# Patient Record
Sex: Female | Born: 1945 | Race: White | Hispanic: No | Marital: Married | Smoking: Former smoker
Health system: Southern US, Community
[De-identification: ages and names within clinical notes are randomized; demographics above are authoritative.]

## PROBLEM LIST (undated history)

## (undated) DIAGNOSIS — K219 Gastro-esophageal reflux disease without esophagitis: Secondary | ICD-10-CM

## (undated) DIAGNOSIS — T7840XA Allergy, unspecified, initial encounter: Secondary | ICD-10-CM

## (undated) DIAGNOSIS — D649 Anemia, unspecified: Secondary | ICD-10-CM

## (undated) DIAGNOSIS — IMO0001 Reserved for inherently not codable concepts without codable children: Secondary | ICD-10-CM

## (undated) DIAGNOSIS — K5732 Diverticulitis of large intestine without perforation or abscess without bleeding: Secondary | ICD-10-CM

## (undated) DIAGNOSIS — I10 Essential (primary) hypertension: Secondary | ICD-10-CM

## (undated) DIAGNOSIS — M1990 Unspecified osteoarthritis, unspecified site: Secondary | ICD-10-CM

## (undated) DIAGNOSIS — E785 Hyperlipidemia, unspecified: Secondary | ICD-10-CM

## (undated) DIAGNOSIS — F419 Anxiety disorder, unspecified: Secondary | ICD-10-CM

## (undated) HISTORY — DX: Diverticulitis of large intestine without perforation or abscess without bleeding: K57.32

## (undated) HISTORY — PX: FINGER SURGERY: SHX640

## (undated) HISTORY — DX: Essential (primary) hypertension: I10

## (undated) HISTORY — PX: BREAST BIOPSY: SHX20

## (undated) HISTORY — DX: Hyperlipidemia, unspecified: E78.5

## (undated) HISTORY — DX: Gastro-esophageal reflux disease without esophagitis: K21.9

## (undated) HISTORY — DX: Unspecified osteoarthritis, unspecified site: M19.90

## (undated) HISTORY — DX: Anxiety disorder, unspecified: F41.9

## (undated) HISTORY — DX: Allergy, unspecified, initial encounter: T78.40XA

---

## 1982-09-27 HISTORY — PX: FINGER FRACTURE SURGERY: SHX638

## 2002-07-11 DIAGNOSIS — I10 Essential (primary) hypertension: Secondary | ICD-10-CM

## 2002-07-11 HISTORY — DX: Essential (primary) hypertension: I10

## 2004-06-30 ENCOUNTER — Ambulatory Visit: Payer: Self-pay | Admitting: Family Medicine

## 2006-03-22 ENCOUNTER — Emergency Department: Payer: Self-pay | Admitting: Unknown Physician Specialty

## 2006-03-23 ENCOUNTER — Ambulatory Visit: Payer: Self-pay | Admitting: Unknown Physician Specialty

## 2006-04-25 ENCOUNTER — Ambulatory Visit: Payer: Self-pay | Admitting: Family Medicine

## 2006-11-09 DIAGNOSIS — K5732 Diverticulitis of large intestine without perforation or abscess without bleeding: Secondary | ICD-10-CM

## 2006-11-09 HISTORY — DX: Diverticulitis of large intestine without perforation or abscess without bleeding: K57.32

## 2008-11-18 DIAGNOSIS — F4320 Adjustment disorder, unspecified: Secondary | ICD-10-CM | POA: Insufficient documentation

## 2012-09-13 ENCOUNTER — Ambulatory Visit: Payer: Self-pay | Admitting: Internal Medicine

## 2013-07-28 DIAGNOSIS — R2230 Localized swelling, mass and lump, unspecified upper limb: Secondary | ICD-10-CM | POA: Insufficient documentation

## 2014-03-25 ENCOUNTER — Encounter: Payer: Self-pay | Admitting: Family Medicine

## 2014-03-27 ENCOUNTER — Encounter: Payer: Self-pay | Admitting: Family Medicine

## 2014-04-27 ENCOUNTER — Encounter: Payer: Self-pay | Admitting: Family Medicine

## 2014-08-26 ENCOUNTER — Ambulatory Visit: Payer: Self-pay | Admitting: Family Medicine

## 2014-11-12 ENCOUNTER — Ambulatory Visit: Payer: Self-pay | Admitting: Family Medicine

## 2014-11-26 HISTORY — PX: COLONOSCOPY: SHX174

## 2014-12-19 ENCOUNTER — Ambulatory Visit: Payer: Self-pay | Admitting: Gastroenterology

## 2014-12-19 LAB — HM COLONOSCOPY

## 2015-01-07 ENCOUNTER — Ambulatory Visit
Admit: 2015-01-07 | Disposition: A | Discharge status: Home / Self Care | Payer: Self-pay | Attending: Gastroenterology | Admitting: Gastroenterology

## 2015-01-09 DIAGNOSIS — N63 Unspecified lump in unspecified breast: Secondary | ICD-10-CM | POA: Insufficient documentation

## 2015-03-18 DIAGNOSIS — F419 Anxiety disorder, unspecified: Secondary | ICD-10-CM | POA: Insufficient documentation

## 2015-03-18 DIAGNOSIS — E663 Overweight: Secondary | ICD-10-CM | POA: Insufficient documentation

## 2015-03-18 DIAGNOSIS — J45909 Unspecified asthma, uncomplicated: Secondary | ICD-10-CM | POA: Insufficient documentation

## 2015-03-18 DIAGNOSIS — E039 Hypothyroidism, unspecified: Secondary | ICD-10-CM | POA: Insufficient documentation

## 2015-03-18 DIAGNOSIS — K635 Polyp of colon: Secondary | ICD-10-CM | POA: Insufficient documentation

## 2015-03-18 DIAGNOSIS — E038 Other specified hypothyroidism: Secondary | ICD-10-CM | POA: Insufficient documentation

## 2015-03-18 DIAGNOSIS — M1990 Unspecified osteoarthritis, unspecified site: Secondary | ICD-10-CM | POA: Insufficient documentation

## 2015-04-01 ENCOUNTER — Other Ambulatory Visit: Payer: Self-pay | Admitting: Family Medicine

## 2015-04-01 DIAGNOSIS — K219 Gastro-esophageal reflux disease without esophagitis: Secondary | ICD-10-CM

## 2015-04-02 DIAGNOSIS — K219 Gastro-esophageal reflux disease without esophagitis: Secondary | ICD-10-CM | POA: Insufficient documentation

## 2015-04-14 ENCOUNTER — Other Ambulatory Visit: Payer: Self-pay | Admitting: Family Medicine

## 2015-04-14 DIAGNOSIS — E7800 Pure hypercholesterolemia, unspecified: Secondary | ICD-10-CM

## 2015-05-05 ENCOUNTER — Encounter: Payer: Self-pay | Admitting: Family Medicine

## 2015-05-05 ENCOUNTER — Ambulatory Visit (INDEPENDENT_AMBULATORY_CARE_PROVIDER_SITE_OTHER): Payer: Medicare Other | Admitting: Family Medicine

## 2015-05-05 VITALS — BP 126/68 | HR 84 | Temp 97.7°F | Resp 16 | Ht 63.0 in | Wt 169.0 lb

## 2015-05-05 DIAGNOSIS — R0602 Shortness of breath: Secondary | ICD-10-CM

## 2015-05-05 DIAGNOSIS — E7800 Pure hypercholesterolemia, unspecified: Secondary | ICD-10-CM

## 2015-05-05 DIAGNOSIS — I10 Essential (primary) hypertension: Secondary | ICD-10-CM | POA: Diagnosis not present

## 2015-05-05 DIAGNOSIS — F419 Anxiety disorder, unspecified: Secondary | ICD-10-CM

## 2015-05-05 DIAGNOSIS — Z0000 Encounter for general adult medical examination without abnormal findings: Secondary | ICD-10-CM

## 2015-05-05 DIAGNOSIS — E038 Other specified hypothyroidism: Secondary | ICD-10-CM

## 2015-05-05 DIAGNOSIS — E039 Hypothyroidism, unspecified: Secondary | ICD-10-CM

## 2015-05-05 LAB — POCT URINALYSIS DIPSTICK
BILIRUBIN UA: NEGATIVE
Blood, UA: NEGATIVE
Glucose, UA: NEGATIVE
KETONES UA: NEGATIVE
Leukocytes, UA: NEGATIVE
Nitrite, UA: NEGATIVE
Protein, UA: NEGATIVE
SPEC GRAV UA: 1.015
Urobilinogen, UA: 0.2
pH, UA: 6

## 2015-05-05 MED ORDER — ALPRAZOLAM 0.5 MG PO TABS: 0.5000 mg | ORAL_TABLET | Freq: Every day | ORAL | Status: DC

## 2015-05-05 NOTE — Patient Instructions (Signed)
Gastroesophageal Reflux Disease, Adult Gastroesophageal reflux disease (GERD) happens when acid from your stomach flows up into the esophagus. When acid comes in contact with the esophagus, the acid causes soreness (inflammation) in the esophagus. Over time, GERD may create small holes (ulcers) in the lining of the esophagus. CAUSES   Increased body weight. This puts pressure on the stomach, making acid rise from the stomach into the esophagus.  Smoking. This increases acid production in the stomach.  Drinking alcohol. This causes decreased pressure in the lower esophageal sphincter (valve or ring of muscle between the esophagus and stomach), allowing acid from the stomach into the esophagus.  Late evening meals and a full stomach. This increases pressure and acid production in the stomach.  A malformed lower esophageal sphincter. Sometimes, no cause is found. SYMPTOMS   Burning pain in the lower part of the mid-chest behind the breastbone and in the mid-stomach area. This may occur twice a week or more often.  Trouble swallowing.  Sore throat.  Dry cough.  Asthma-like symptoms including chest tightness, shortness of breath, or wheezing. DIAGNOSIS  Your caregiver may be able to diagnose GERD based on your symptoms. In some cases, X-rays and other tests may be done to check for complications or to check the condition of your stomach and esophagus. TREATMENT  Your caregiver may recommend over-the-counter or prescription medicines to help decrease acid production. Ask your caregiver before starting or adding any new medicines.  HOME CARE INSTRUCTIONS   Change the factors that you can control. Ask your caregiver for guidance concerning weight loss, quitting smoking, and alcohol consumption.  Avoid foods and drinks that make your symptoms worse, such as:  Caffeine or alcoholic drinks.  Chocolate.  Peppermint or mint flavorings.  Garlic and onions.  Spicy foods.  Citrus fruits,  such as oranges, lemons, or limes.  Tomato-based foods such as sauce, chili, salsa, and pizza.  Fried and fatty foods.  Avoid lying down for the 3 hours prior to your bedtime or prior to taking a nap.  Eat small, frequent meals instead of large meals.  Wear loose-fitting clothing. Do not wear anything tight around your waist that causes pressure on your stomach.  Raise the head of your bed 6 to 8 inches with wood blocks to help you sleep. Extra pillows will not help.  Only take over-the-counter or prescription medicines for pain, discomfort, or fever as directed by your caregiver.  Do not take aspirin, ibuprofen, or other nonsteroidal anti-inflammatory drugs (NSAIDs). SEEK IMMEDIATE MEDICAL CARE IF:   You have pain in your arms, neck, jaw, teeth, or back.  Your pain increases or changes in intensity or duration.  You develop nausea, vomiting, or sweating (diaphoresis).  You develop shortness of breath, or you faint.  Your vomit is green, yellow, black, or looks like coffee grounds or blood.  Your stool is red, bloody, or black. These symptoms could be signs of other problems, such as heart disease, gastric bleeding, or esophageal bleeding. MAKE SURE YOU:   Understand these instructions.  Will watch your condition.  Will get help right away if you are not doing well or get worse. Document Released: 06/23/2005 Document Revised: 12/06/2011 Document Reviewed: 04/02/2011 Exit[employer] Patient Information 2015 [employer]. This information is not intended to replace advice given to you by your health care provider. Make sure you discuss any questions you have with your health care provider.  

## 2015-05-05 NOTE — Progress Notes (Signed)
Patient ID: Kerry Stone, female   DOB: 1946/05/18, 69 y.o.   MRN: [id number]       Patient: Kerry Stone, Female    DOB: [birth year], 69 y.o.   MRN: [id number] Visit Date: 05/05/2015  Today's Provider: Lorie Phenix, MD   Chief Complaint  Patient presents with  . Medicare Wellness  . Shortness of Breath   Subjective:    Annual wellness visit Kerry Stone is a 69 y.o. female. She feels well. She reports exercising daily (walking). She reports she is sleeping well. 04/27/13 CPE 01/06/15 Mammogram  12/19/14 Colonoscopy  07/11/13 EKG 08/26/14 BMD-WNL   Shortness of Breath The current episode started more than 1 month ago. The problem occurs constantly. The problem has been gradually improving. Pertinent negatives include no abdominal pain, chest pain, fever, headaches, leg pain, leg swelling, PND, rash, rhinorrhea, sore throat, swollen glands, syncope, vomiting or wheezing. Exacerbated by: Extreme activity, like stairs, not walking itself.  She has tried oral steroids for the symptoms. The treatment provided mild relief. Her past medical history is significant for allergies.   The shortness of breathe is with extreme exertion like stairs only. Not with every day activity . Has not been as active in the past year with her wife's illness. Hoping to increase activity now that things have settled down and she is doing better.    Results for orders placed or performed in visit on 05/05/15  POCT urinalysis dipstick  Result Value Ref Range   Color, UA straw    Clarity, UA clear    Glucose, UA neg    Bilirubin, UA neg    Ketones, UA neg    Spec Grav, UA 1.015    Blood, UA neg    pH, UA 6.0    Protein, UA neg    Urobilinogen, UA 0.2    Nitrite, UA neg    Leukocytes, UA Negative Negative       -----------------------------------------------------------   Review of Systems  Constitutional: Negative.  Negative for fever.  HENT: Positive for postnasal drip. Negative  for rhinorrhea and sore throat.   Eyes: Negative.   Respiratory: Positive for shortness of breath. Negative for wheezing.   Cardiovascular: Negative for chest pain, leg swelling, syncope and PND.  Gastrointestinal: Negative.  Negative for vomiting and abdominal pain.  Endocrine: Negative.   Genitourinary: Negative.   Musculoskeletal: Positive for back pain.  Skin: Negative for rash.  Allergic/Immunologic: Positive for environmental allergies.  Neurological: Negative.  Negative for headaches.  Hematological: Negative.   Psychiatric/Behavioral: Negative.     History   Social History  . Marital Status: Married    Spouse Name: Cordelia Pen  . Number of Children: 1  . Years of Education: N/A   Occupational History  . Not on file.   Social History Main Topics  . Smoking status: Former Games developer  . Smokeless tobacco: Never Used  . Alcohol Use: Yes     Comment: occasional  . Drug Use: No  . Sexual Activity: Not on file   Other Topics Concern  . Not on file   Social History Narrative    Patient Active Problem List   Diagnosis Date Noted  . GERD (gastroesophageal reflux disease) 04/02/2015  . Anxiety 03/18/2015  . Arthritis 03/18/2015  . Colon polyp 03/18/2015  . Overweight 03/18/2015  . RAD (reactive airway disease) 03/18/2015  . 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 Breast lump 01/09/2015  . Lump on finger 07/28/2013  . Adjustment reaction of adult life  11/18/2008  . Psoriasis 08/05/2008  . Diverticulitis of colon 11/09/2006  . Hypercholesteremia 07/11/2002  . BP (high blood pressure) 07/11/2002    Past Surgical History  Procedure Laterality Date  . Finger fracture surgery Left 1984    thumb  . Finger surgery      bone spur and cyst repair. Dr. Cathie Beams at [facility]  . Breast biopsy Right     Her family history includes Anuerysm in her father; Diabetes in her mother; Hyperlipidemia in her father; Hypertension in her father and mother.    Previous Medications    CHLORTHALIDONE (HYGROTON) 25 MG TABLET    Take 25 mg by mouth.    DESONIDE (DESOWEN) 0.05 % CREAM    Apply topically.    ESCITALOPRAM (LEXAPRO) 10 MG TABLET    Take 5 mg by mouth daily.    ESTRADIOL (ESTRACE) 0.1 MG/GM VAGINAL CREAM    Place vaginally.    FLUTICASONE (CUTIVATE) 0.005 % OINTMENT    continuously as needed.   IBUPROFEN (ADVIL,MOTRIN) 200 MG TABLET    as needed.   LISINOPRIL (PRINIVIL,ZESTRIL) 2.5 MG TABLET    Take 2.5 mg by mouth daily.    LORATADINE 10 MG CAPS    Take by mouth as needed.   OMEPRAZOLE (PRILOSEC) 20 MG CAPSULE    TAKE 1 CAPSULE BY MOUTH DAILY   SIMVASTATIN (ZOCOR) 20 MG TABLET    TAKE 1 TABLET BY MOUTH EVERY DAY    Patient Care Team: Lorie Phenix, MD as PCP - General (Family Medicine)     Objective:   Vitals: BP 126/68 mmHg  Pulse 84  Temp(Src) 97.7 F (36.5 C) (Oral)  Resp 16  Ht 5\' 3"  (1.6 m)  Wt 169 lb (76.658 kg)  BMI 29.94 kg/m2  SpO2 98%  Physical Exam  Constitutional: She is oriented to person, place, and time. She appears well-developed and well-nourished.  HENT:  Head: Normocephalic and atraumatic.  Right Ear: Tympanic membrane, external ear and ear canal normal.  Left Ear: Tympanic membrane, external ear and ear canal normal.  Nose: Nose normal.  Mouth/Throat: Uvula is midline, oropharynx is clear and moist and mucous membranes are normal.  Eyes: Conjunctivae, EOM and lids are normal. Pupils are equal, round, and reactive to light.  Neck: Trachea normal and normal range of motion. Neck supple. Carotid bruit is not present. No thyroid mass and no thyromegaly present.  Cardiovascular: Normal rate, regular rhythm and normal heart sounds.   Pulmonary/Chest: Effort normal and breath sounds normal.  Abdominal: Soft. Normal appearance and bowel sounds are normal. There is no hepatosplenomegaly. There is no tenderness.  Musculoskeletal: Normal range of motion.  Lymphadenopathy:    She has no cervical adenopathy.    She has no axillary  adenopathy.  Neurological: She is alert and oriented to person, place, and time. She has normal strength. No cranial nerve deficit.  Skin: Skin is warm, dry and intact.  Psychiatric: She has a normal mood and affect. Her speech is normal and behavior is normal. Judgment and thought content normal. Cognition and memory are normal.    Activities of Daily Living In your present state of health, do you have any difficulty performing the following activities: 05/05/2015  Hearing? N  Vision? N  Difficulty concentrating or making decisions? N  Walking or climbing stairs? N  Dressing or bathing? N  Doing errands, shopping? N    Fall Risk Assessment Fall Risk  05/05/2015  Falls in the past year? No  Depression Screen PHQ 2/9 Scores 05/05/2015  PHQ - 2 Score 0    Cognitive Testing - 6-CIT  Correct? Score   What year is it? yes 0 0 or 4  What month is it? yes 0 0 or 3  Memorize:    Floyde Parkins,  44,  [address],  [place],      What time is it? (within 1 hour) yes 0 0 or 3  Count backwards from 20 yes 0 0, 2, or 4  Name the months of the year yes 0 0, 2, or 4  Repeat name & address above yes 0 0, 2, 4, 6, 8, or 10       TOTAL SCORE  0/28   Interpretation:  Normal  Normal (0-7) Abnormal (8-28)       Assessment & Plan:     Annual Wellness Visit  Reviewed patient's Family Medical History Reviewed and updated list of patient's medical providers Assessment of cognitive impairment was done Assessed patient's functional ability Established a written schedule for health screening services Health Risk Assessent Completed and Reviewed  Exercise Activities and Dietary recommendations Goals    . Exercise 150 minutes per week (moderate activity)       Immunization History  Administered Date(s) Administered  . Pneumococcal Polysaccharide-23 07/11/2013  . Td 09/17/2003    Health Maintenance  Topic Date Due  . Hepatitis C Screening  1945-12-06  . MAMMOGRAM  07/05/1966  .  COLONOSCOPY  07/05/1998  . ZOSTAVAX  07/05/2008  . DEXA SCAN  07/05/2013  . TETANUS/TDAP  09/16/2013  . PNA vac Low Risk Adult (2 of 2 - PCV13) 07/11/2014  . INFLUENZA VACCINE  04/28/2015         1. Medicare annual wellness visit, subsequent - POCT urinalysis dipstick  2. Shortness of breath on exertion New problem. Patient declined further work up for now. Patient reports that she has started walking daily and feels that her symptoms are gradually improving. Patient advised to continue monitoring and patient was advised to call if symptoms continue or worsens.  - CBC with Differential/Platelet  3. Essential hypertension - Comprehensive metabolic panel  4. Subclinical hypothyroidism - TSH  5. Hypercholesteremia - Lipid Panel With LDL/HDL Ratio  6. Anxiety Stable. Patient advised to continue current medications. - ALPRAZolam (XANAX) 0.5 MG tablet; Take 1 tablet (0.5 mg total) by mouth at bedtime.  Dispense: 30 tablet; Refill: 5  Patient was seen and examined by Leo Grosser, MD, and note scribed by Rondel Baton, CMA.   I have reviewed the document for accuracy and completeness and I agree with above. Leo Grosser, MD   Lorie Phenix, MD       ------------------------------------------------------------------------------------------------------------

## 2015-05-15 ENCOUNTER — Telehealth: Payer: Self-pay

## 2015-05-15 DIAGNOSIS — R0602 Shortness of breath: Secondary | ICD-10-CM

## 2015-05-15 DIAGNOSIS — K5732 Diverticulitis of large intestine without perforation or abscess without bleeding: Secondary | ICD-10-CM

## 2015-05-15 DIAGNOSIS — R58 Hemorrhage, not elsewhere classified: Secondary | ICD-10-CM

## 2015-05-15 DIAGNOSIS — I10 Essential (primary) hypertension: Secondary | ICD-10-CM

## 2015-05-15 DIAGNOSIS — D709 Neutropenia, unspecified: Secondary | ICD-10-CM

## 2015-05-15 DIAGNOSIS — E538 Deficiency of other specified B group vitamins: Secondary | ICD-10-CM

## 2015-05-15 DIAGNOSIS — E7800 Pure hypercholesterolemia, unspecified: Secondary | ICD-10-CM

## 2015-05-15 DIAGNOSIS — Z0000 Encounter for general adult medical examination without abnormal findings: Secondary | ICD-10-CM

## 2015-05-15 DIAGNOSIS — D649 Anemia, unspecified: Secondary | ICD-10-CM

## 2015-05-15 LAB — LIPID PANEL WITH LDL/HDL RATIO
Cholesterol, Total: 166 mg/dL (ref 100–199)
HDL: 50 mg/dL (ref >39)
LDL Calculated: 89 mg/dL (ref 0–99)
LDL/HDL RATIO: 1.8 ratio (ref 0.0–3.2)
Triglycerides: 136 mg/dL (ref 0–149)
VLDL Cholesterol Cal: 27 mg/dL (ref 5–40)

## 2015-05-15 LAB — CBC WITH DIFFERENTIAL/PLATELET
BASOS: 2 %
Basophils Absolute: 0.1 10*3/uL (ref 0.0–0.2)
EOS (ABSOLUTE): 0.2 10*3/uL (ref 0.0–0.4)
Eos: 6 %
Hematocrit: 28.6 % — ABNORMAL LOW (ref 34.0–46.6)
Hemoglobin: 9 g/dL — ABNORMAL LOW (ref 11.1–15.9)
IMMATURE GRANULOCYTES: 0 %
Immature Grans (Abs): 0 10*3/uL (ref 0.0–0.1)
LYMPHS ABS: 1 10*3/uL (ref 0.7–3.1)
Lymphs: 32 %
MCH: 23.1 pg — ABNORMAL LOW (ref 26.6–33.0)
MCHC: 31.5 g/dL (ref 31.5–35.7)
MCV: 73 fL — AB (ref 79–97)
MONOS ABS: 0.4 10*3/uL (ref 0.1–0.9)
Monocytes: 12 %
Neutrophils Absolute: 1.6 10*3/uL (ref 1.4–7.0)
Neutrophils: 48 %
PLATELETS: 216 10*3/uL (ref 150–379)
RBC: 3.9 x10E6/uL (ref 3.77–5.28)
RDW: 17.7 % — AB (ref 12.3–15.4)
WBC: 3.3 10*3/uL — AB (ref 3.4–10.8)

## 2015-05-15 LAB — COMPREHENSIVE METABOLIC PANEL
ALT: 20 IU/L (ref 0–32)
AST: 26 IU/L (ref 0–40)
Albumin/Globulin Ratio: 2.2 (ref 1.1–2.5)
Albumin: 4.2 g/dL (ref 3.6–4.8)
Alkaline Phosphatase: 60 IU/L (ref 39–117)
BUN/Creatinine Ratio: 19 (ref 11–26)
BUN: 15 mg/dL (ref 8–27)
Bilirubin Total: 0.3 mg/dL (ref 0.0–1.2)
CALCIUM: 9.3 mg/dL (ref 8.7–10.3)
CHLORIDE: 103 mmol/L (ref 97–108)
CO2: 21 mmol/L (ref 18–29)
Creatinine, Ser: 0.8 mg/dL (ref 0.57–1.00)
GFR calc Af Amer: 89 mL/min/{1.73_m2} (ref >59)
GFR, EST NON AFRICAN AMERICAN: 77 mL/min/{1.73_m2} (ref >59)
Globulin, Total: 1.9 g/dL (ref 1.5–4.5)
Glucose: 92 mg/dL (ref 65–99)
Potassium: 3.9 mmol/L (ref 3.5–5.2)
Sodium: 143 mmol/L (ref 134–144)
Total Protein: 6.1 g/dL (ref 6.0–8.5)

## 2015-05-15 LAB — TSH: TSH: 3.31 u[IU]/mL (ref 0.450–4.500)

## 2015-05-15 NOTE — Telephone Encounter (Signed)
Dr. Elease Hashimoto it is not letting me print the lab slips, what Dx can I use, since she is Medicare pt. Please advise.  Thanks,

## 2015-05-15 NOTE — Telephone Encounter (Signed)
Neutropenia and anemia. Thanks.

## 2015-05-15 NOTE — Telephone Encounter (Signed)
-----   Message from Lorie Phenix, MD sent at 05/15/2015  1:31 PM EDT ----- Talked with patient. Has had recent normal colonoscopy. Will repeat labs. Please print out labs slip with CBC, Ferritin, Iron, TIBC and  B12 For patient to have labs drawn tomorrow. Knows to go to ER if any bleeding.  Thanks.

## 2015-05-16 ENCOUNTER — Telehealth: Payer: Self-pay | Admitting: Family Medicine

## 2015-05-16 NOTE — Telephone Encounter (Signed)
Pt called wanting a copy of of her last 2 times that she had lab work done.  She is picking up an order this morning to have labs done again.    Her call back is [phone]  Thanks Barth Kirks

## 2015-05-16 NOTE — Telephone Encounter (Signed)
Printed lab slips and placed them upfront for ready to pick up.  Thanks,

## 2015-05-17 LAB — CBC
HEMOGLOBIN: 9 g/dL — AB (ref 11.1–15.9)
Hematocrit: 29.5 % — ABNORMAL LOW (ref 34.0–46.6)
MCH: 23.4 pg — AB (ref 26.6–33.0)
MCHC: 30.5 g/dL — AB (ref 31.5–35.7)
MCV: 77 fL — ABNORMAL LOW (ref 79–97)
Platelets: 220 10*3/uL (ref 150–379)
RBC: 3.84 x10E6/uL (ref 3.77–5.28)
RDW: 17.4 % — ABNORMAL HIGH (ref 12.3–15.4)
WBC: 3.5 10*3/uL (ref 3.4–10.8)

## 2015-05-17 LAB — IRON AND TIBC
Iron Saturation: 4 % — CL (ref 15–55)
Iron: 18 ug/dL — ABNORMAL LOW (ref 27–139)
Total Iron Binding Capacity: 429 ug/dL (ref 250–450)
UIBC: 411 ug/dL — ABNORMAL HIGH (ref 118–369)

## 2015-05-17 LAB — FERRITIN: Ferritin: 6 ng/mL — ABNORMAL LOW (ref 15–150)

## 2015-05-17 LAB — VITAMIN B12: Vitamin B-12: 382 pg/mL (ref 211–946)

## 2015-05-19 ENCOUNTER — Encounter: Payer: Self-pay | Admitting: Family Medicine

## 2015-05-19 ENCOUNTER — Ambulatory Visit (INDEPENDENT_AMBULATORY_CARE_PROVIDER_SITE_OTHER): Payer: Medicare Other | Admitting: Family Medicine

## 2015-05-19 VITALS — BP 100/60 | HR 72 | Temp 98.2°F | Resp 16 | Wt 171.2 lb

## 2015-05-19 DIAGNOSIS — D509 Iron deficiency anemia, unspecified: Secondary | ICD-10-CM | POA: Diagnosis not present

## 2015-05-19 LAB — IFOBT (OCCULT BLOOD): IMMUNOLOGICAL FECAL OCCULT BLOOD TEST: NEGATIVE

## 2015-05-19 MED ORDER — FERROUS GLUCONATE 324 (38 FE) MG PO TABS: 324.0000 mg | ORAL_TABLET | Freq: Two times a day (BID) | ORAL | Status: DC

## 2015-05-19 MED ORDER — FERROUS SULFATE 325 (65 FE) MG PO TABS
325.0000 mg | ORAL_TABLET | Freq: Two times a day (BID) | ORAL | Status: DC
Start: 2015-05-19 — End: 2015-07-30

## 2015-05-19 NOTE — Progress Notes (Signed)
       Patient: Kerry Stone Female    DOB: 09-20-1946   69 y.o.   MRN: [id number] Visit Date: 05/19/2015  Today's Provider: Lorie Phenix, MD   Chief Complaint  Patient presents with  . Anemia   Subjective:    Anemia Presents for follow-up visit. Symptoms include abdominal pain, light-headedness and malaise/fatigue. There has been no bruising/bleeding easily, leg swelling or palpitations. (Hands cramping today)       No Known Allergies Previous Medications   ALPRAZOLAM (XANAX) 0.5 MG TABLET    Take 1 tablet (0.5 mg total) by mouth at bedtime.   CHLORTHALIDONE (HYGROTON) 25 MG TABLET    Take 25 mg by mouth.    DESONIDE (DESOWEN) 0.05 % CREAM    Apply topically.    ESCITALOPRAM (LEXAPRO) 10 MG TABLET    Take 5 mg by mouth daily.    ESTRADIOL (ESTRACE) 0.1 MG/GM VAGINAL CREAM    Place vaginally.    FLUTICASONE (CUTIVATE) 0.005 % OINTMENT    continuously as needed.   IBUPROFEN (ADVIL,MOTRIN) 200 MG TABLET    as needed.   LISINOPRIL (PRINIVIL,ZESTRIL) 2.5 MG TABLET    Take 2.5 mg by mouth daily.    LORATADINE 10 MG CAPS    Take by mouth as needed.   OMEPRAZOLE (PRILOSEC) 20 MG CAPSULE    TAKE 1 CAPSULE BY MOUTH DAILY   SIMVASTATIN (ZOCOR) 20 MG TABLET    TAKE 1 TABLET BY MOUTH EVERY DAY    Review of Systems  Constitutional: Positive for malaise/fatigue.  HENT: Negative.   Eyes: Negative.   Respiratory: Positive for shortness of breath.   Cardiovascular: Negative.  Negative for chest pain and palpitations.  Gastrointestinal: Positive for abdominal pain.  Endocrine: Negative.   Genitourinary: Negative.   Musculoskeletal: Negative.   Skin: Negative.   Allergic/Immunologic: Negative.   Neurological: Positive for light-headedness.  Hematological: Negative.  Does not bruise/bleed easily.  Psychiatric/Behavioral: Negative.     Social History  Substance Use Topics  . Smoking status: Former Games developer  . Smokeless tobacco: Never Used  . Alcohol Use: Yes     Comment:  occasional   Objective:   BP 100/60 mmHg  Pulse 72  Temp(Src) 98.2 F (36.8 C) (Oral)  Resp 16  Wt 171 lb 3.2 oz (77.656 kg)  Physical Exam  Constitutional: She is oriented to person, place, and time. She appears well-developed and well-nourished.  Genitourinary: Guaiac negative stool.  Neurological: She is alert and oriented to person, place, and time.        Assessment & Plan:     1. Iron deficiency anemia Suspect slow bleed. Will start iron and refer.   ER if any signs or symptoms of an acute bleed. Patient understands and is in agreement with treatment plan.   - IFOBT POC (occult bld, rslt in office) Results for orders placed or performed in visit on 05/19/15  IFOBT POC (occult bld, rslt in office)  Result Value Ref Range   IFOBT Negative     - Ambulatory referral to Gastroenterology - ferrous sulfate 325 (65 FE) MG tablet; Take 1 tablet (325 mg total) by mouth 2 (two) times daily with a meal.  Dispense: 60 tablet; Refill: 3       Lorie Phenix, MD  [facility]

## 2015-05-25 ENCOUNTER — Encounter: Payer: Self-pay | Admitting: Family Medicine

## 2015-05-25 DIAGNOSIS — D649 Anemia, unspecified: Secondary | ICD-10-CM

## 2015-05-26 DIAGNOSIS — D649 Anemia, unspecified: Secondary | ICD-10-CM | POA: Insufficient documentation

## 2015-05-27 ENCOUNTER — Encounter: Payer: Self-pay | Admitting: Family Medicine

## 2015-05-27 LAB — CBC WITH DIFFERENTIAL/PLATELET
BASOS ABS: 0.1 10*3/uL (ref 0.0–0.2)
Basos: 1 %
EOS (ABSOLUTE): 0.2 10*3/uL (ref 0.0–0.4)
Eos: 3 %
Hematocrit: 32.7 % — ABNORMAL LOW (ref 34.0–46.6)
Hemoglobin: 10.1 g/dL — ABNORMAL LOW (ref 11.1–15.9)
IMMATURE GRANS (ABS): 0 10*3/uL (ref 0.0–0.1)
IMMATURE GRANULOCYTES: 0 %
LYMPHS: 23 %
Lymphocytes Absolute: 1.4 10*3/uL (ref 0.7–3.1)
MCH: 24.2 pg — ABNORMAL LOW (ref 26.6–33.0)
MCHC: 30.9 g/dL — ABNORMAL LOW (ref 31.5–35.7)
MCV: 78 fL — ABNORMAL LOW (ref 79–97)
MONOS ABS: 0.8 10*3/uL (ref 0.1–0.9)
Monocytes: 14 %
NEUTROS PCT: 59 %
Neutrophils Absolute: 3.6 10*3/uL (ref 1.4–7.0)
PLATELETS: 260 10*3/uL (ref 150–379)
RBC: 4.18 x10E6/uL (ref 3.77–5.28)
RDW: 21.7 % — AB (ref 12.3–15.4)
WBC: 6.1 10*3/uL (ref 3.4–10.8)

## 2015-05-27 LAB — FERRITIN: FERRITIN: 91 ng/mL (ref 15–150)

## 2015-06-03 ENCOUNTER — Encounter: Payer: Self-pay | Admitting: Family Medicine

## 2015-06-03 DIAGNOSIS — D500 Iron deficiency anemia secondary to blood loss (chronic): Secondary | ICD-10-CM

## 2015-06-05 NOTE — Telephone Encounter (Signed)
Will check labs today and make decision.  Talked with patient. Thanks.

## 2015-06-06 ENCOUNTER — Telehealth: Payer: Self-pay

## 2015-06-06 LAB — CBC WITH DIFFERENTIAL/PLATELET
BASOS ABS: 0 10*3/uL (ref 0.0–0.2)
Basos: 1 %
EOS (ABSOLUTE): 0.1 10*3/uL (ref 0.0–0.4)
Eos: 2 %
Hematocrit: 37.1 % (ref 34.0–46.6)
Hemoglobin: 11.9 g/dL (ref 11.1–15.9)
IMMATURE GRANS (ABS): 0 10*3/uL (ref 0.0–0.1)
Immature Granulocytes: 0 %
LYMPHS: 26 %
Lymphocytes Absolute: 1.2 10*3/uL (ref 0.7–3.1)
MCH: 26.8 pg (ref 26.6–33.0)
MCHC: 32.1 g/dL (ref 31.5–35.7)
MCV: 84 fL (ref 79–97)
MONOCYTES: 9 %
Monocytes Absolute: 0.4 10*3/uL (ref 0.1–0.9)
NEUTROS ABS: 2.9 10*3/uL (ref 1.4–7.0)
Neutrophils: 62 %
PLATELETS: 233 10*3/uL (ref 150–379)
RBC: 4.44 x10E6/uL (ref 3.77–5.28)
RDW: 25.4 % — ABNORMAL HIGH (ref 12.3–15.4)
WBC: 4.7 10*3/uL (ref 3.4–10.8)

## 2015-06-06 NOTE — Telephone Encounter (Signed)
-----   Message from Lorie Phenix, MD sent at 06/06/2015  9:26 AM EDT ----- Labs stable. Hgb 11.9.  Ok to stop iron when on your trip. Thanks.

## 2015-06-06 NOTE — Telephone Encounter (Signed)
Advised pt as directed below. Pt verbalized fully understanding.  Thanks,   

## 2015-06-10 ENCOUNTER — Other Ambulatory Visit: Payer: Self-pay | Admitting: Family Medicine

## 2015-06-10 DIAGNOSIS — I10 Essential (primary) hypertension: Secondary | ICD-10-CM

## 2015-06-19 ENCOUNTER — Telehealth: Payer: Self-pay | Admitting: Family Medicine

## 2015-06-19 DIAGNOSIS — D649 Anemia, unspecified: Secondary | ICD-10-CM

## 2015-06-19 NOTE — Telephone Encounter (Signed)
Pt advised; lab slip at the front desk.   Thanks,   -Vernona Rieger

## 2015-06-19 NOTE — Telephone Encounter (Signed)
Ok to recheck CBC before restarts her medication. Thanks.

## 2015-06-19 NOTE — Telephone Encounter (Signed)
Pt stated that she was able to stop taking the iron pills while on vacation. Pt stated she is back from vacation and is going to see Dr. Servando Snare 06/25/15. Pt is hoping that she doesn't need to take the iron between now and the appt b/c they make her so sick. Pt also wanted to know if she should have her labs done before her appt with Dr. Servando Snare to see what her levels are now. Please advise. Thanks TNP

## 2015-06-24 LAB — CBC WITH DIFFERENTIAL/PLATELET
Basophils Absolute: 0 10*3/uL (ref 0.0–0.2)
Basos: 1 %
EOS (ABSOLUTE): 0.2 10*3/uL (ref 0.0–0.4)
EOS: 4 %
HEMATOCRIT: 37.4 % (ref 34.0–46.6)
Hemoglobin: 12.5 g/dL (ref 11.1–15.9)
IMMATURE GRANS (ABS): 0 10*3/uL (ref 0.0–0.1)
IMMATURE GRANULOCYTES: 1 %
LYMPHS ABS: 1.2 10*3/uL (ref 0.7–3.1)
Lymphs: 27 %
MCH: 27.7 pg (ref 26.6–33.0)
MCHC: 33.4 g/dL (ref 31.5–35.7)
MCV: 83 fL (ref 79–97)
MONOCYTES: 13 %
Monocytes Absolute: 0.6 10*3/uL (ref 0.1–0.9)
NEUTROS ABS: 2.4 10*3/uL (ref 1.4–7.0)
Neutrophils: 54 %
PLATELETS: 192 10*3/uL (ref 150–379)
RBC: 4.52 x10E6/uL (ref 3.77–5.28)
RDW: 22.2 % — AB (ref 12.3–15.4)
WBC: 4.4 10*3/uL (ref 3.4–10.8)

## 2015-06-25 ENCOUNTER — Telehealth: Payer: Self-pay

## 2015-06-25 ENCOUNTER — Other Ambulatory Visit: Payer: Self-pay | Admitting: Gastroenterology

## 2015-06-25 ENCOUNTER — Encounter: Payer: Self-pay | Admitting: Gastroenterology

## 2015-06-25 ENCOUNTER — Encounter: Payer: Self-pay | Admitting: *Deleted

## 2015-06-25 ENCOUNTER — Ambulatory Visit (INDEPENDENT_AMBULATORY_CARE_PROVIDER_SITE_OTHER): Payer: Medicare Other | Admitting: Gastroenterology

## 2015-06-25 ENCOUNTER — Other Ambulatory Visit: Payer: Self-pay

## 2015-06-25 VITALS — BP 124/80 | HR 82 | Temp 97.8°F | Ht 63.0 in | Wt 169.0 lb

## 2015-06-25 DIAGNOSIS — K219 Gastro-esophageal reflux disease without esophagitis: Secondary | ICD-10-CM | POA: Diagnosis not present

## 2015-06-25 DIAGNOSIS — R1084 Generalized abdominal pain: Secondary | ICD-10-CM

## 2015-06-25 DIAGNOSIS — D509 Iron deficiency anemia, unspecified: Secondary | ICD-10-CM | POA: Diagnosis not present

## 2015-06-25 NOTE — Telephone Encounter (Signed)
-----   Message from Lorie Phenix, MD sent at 06/24/2015  9:20 AM EDT ----- Labs normal still. Ok to stay off iron for now and let me know how GI appointment goes. Thanks- Dr. Judie Petit.

## 2015-06-25 NOTE — Progress Notes (Signed)
Primary Care Physician: Lorie Phenix, MD  Primary Gastroenterologist:  Dr. Midge Minium  Chief Complaint  Patient presents with  . Anemia    HPI: Kerry Stone is a 69 y.o. female here for anemia. The patient was found to have significant iron deficiency anemia with low ferritin and low iron with a hemoglobin below 9. The patient denies any black stools or bloody stools or any visible sign of blood loss. She was given iron for a few weeks and states that her blood was checked again. The last blood count showed her to have a hemoglobin of 12.5. She states that when her hemoglobin was low she felt tired and weak but feels fine now. She does have chronic heartburn for which she takes omeprazole as needed but takes Zantac every night. There is no report of any dysphagia, fevers, chills, nausea or vomiting  Current Outpatient Prescriptions  Medication Sig Dispense Refill  . ALPRAZolam (XANAX) 0.5 MG tablet Take 1 tablet (0.5 mg total) by mouth at bedtime. 30 tablet 5  . chlorthalidone (HYGROTON) 25 MG tablet Take 25 mg by mouth.     . desonide (DESOWEN) 0.05 % cream Apply topically.     Marland Kitchen escitalopram (LEXAPRO) 10 MG tablet Take 5 mg by mouth daily.     Marland Kitchen estradiol (ESTRACE) 0.1 MG/GM vaginal cream Place vaginally.     . ferrous sulfate 325 (65 FE) MG tablet Take 1 tablet (325 mg total) by mouth 2 (two) times daily with a meal. 60 tablet 3  . fluticasone (CUTIVATE) 0.005 % ointment continuously as needed.    Marland Kitchen ibuprofen (ADVIL,MOTRIN) 200 MG tablet as needed.    Marland Kitchen lisinopril (PRINIVIL,ZESTRIL) 2.5 MG tablet TAKE 1 TABLET BY MOUTH DAILY 90 tablet 3  . Loratadine 10 MG CAPS Take by mouth as needed.    . ranitidine (ZANTAC) 300 MG tablet Take 300 mg by mouth at bedtime.    Marland Kitchen omeprazole (PRILOSEC) 20 MG capsule TAKE 1 CAPSULE BY MOUTH DAILY (Patient not taking: Reported on 06/25/2015) 90 capsule 3  . simvastatin (ZOCOR) 20 MG tablet TAKE 1 TABLET BY MOUTH EVERY DAY 90 tablet 3   No current  facility-administered medications for this visit.    Allergies as of 06/25/2015  . (No Known Allergies)    ROS:  General: Negative for anorexia, weight loss, fever, chills, fatigue, weakness. ENT: Negative for hoarseness, difficulty swallowing , nasal congestion. CV: Negative for chest pain, angina, palpitations, dyspnea on exertion, peripheral edema.  Respiratory: Negative for dyspnea at rest, dyspnea on exertion, cough, sputum, wheezing.  GI: See history of present illness. GU:  Negative for dysuria, hematuria, urinary incontinence, urinary frequency, nocturnal urination.  Endo: Negative for unusual weight change.    Physical Examination:   BP 124/80 mmHg  Pulse 82  Temp(Src) 97.8 F (36.6 C) (Oral)  Ht  (1.6 m)  Wt 169 lb (76.658 kg)  BMI 29.94 kg/m2  General: Well-nourished, well-developed in no acute distress.  Eyes: No icterus. Conjunctivae pink. Neuro: Alert and oriented x 3.  Grossly intact. Skin: Warm and dry, no jaundice.   Psych: Alert and cooperative, normal mood and affect.  Labs:    Imaging Studies: No results found.  Assessment and Plan:   Kerry Stone is a 69 y.o. y/o female comes in with a history of chronic reflux and anemia with iron deficiency. The patient had a colonoscopy by me in the past which did not show any cause for this. The patient will  be set up for an upper endoscopy to look for any source of upper GI bleeding. The patient has been told to stop the then tacked at night and take her omeprazole on a regular basis every evening. The patient will follow up at time of the upper endoscopy. I have discussed risks & benefits which include, but are not limited to, bleeding, infection, perforation & drug reaction.  The patient agrees with this plan & written consent will be obtained.      Note: This dictation was prepared with Dragon dictation along with smaller phrase technology. Any transcriptional errors that result from this process are  unintentional.

## 2015-06-25 NOTE — Telephone Encounter (Signed)
Advised pt of lab results. Pt verbally acknowledges understanding. Emily Drozdowski, CMA   

## 2015-06-26 ENCOUNTER — Ambulatory Visit: Payer: Medicare Other | Admitting: Anesthesiology

## 2015-06-26 ENCOUNTER — Encounter: Payer: Self-pay | Admitting: Gastroenterology

## 2015-06-26 ENCOUNTER — Encounter
Admission: RE | Disposition: A | Discharge status: Home / Self Care | Payer: Self-pay | Source: Ambulatory Visit | Attending: Gastroenterology

## 2015-06-26 ENCOUNTER — Ambulatory Visit
Admission: RE | Admit: 2015-06-26 | Discharge: 2015-06-26 | Disposition: A | Discharge status: Home / Self Care | Payer: Medicare Other | Source: Ambulatory Visit | Attending: Gastroenterology | Admitting: Gastroenterology

## 2015-06-26 DIAGNOSIS — K219 Gastro-esophageal reflux disease without esophagitis: Secondary | ICD-10-CM | POA: Insufficient documentation

## 2015-06-26 DIAGNOSIS — K449 Diaphragmatic hernia without obstruction or gangrene: Secondary | ICD-10-CM | POA: Insufficient documentation

## 2015-06-26 DIAGNOSIS — E785 Hyperlipidemia, unspecified: Secondary | ICD-10-CM | POA: Insufficient documentation

## 2015-06-26 DIAGNOSIS — M1990 Unspecified osteoarthritis, unspecified site: Secondary | ICD-10-CM | POA: Insufficient documentation

## 2015-06-26 DIAGNOSIS — F419 Anxiety disorder, unspecified: Secondary | ICD-10-CM | POA: Diagnosis not present

## 2015-06-26 DIAGNOSIS — R1310 Dysphagia, unspecified: Secondary | ICD-10-CM | POA: Diagnosis present

## 2015-06-26 DIAGNOSIS — I10 Essential (primary) hypertension: Secondary | ICD-10-CM | POA: Insufficient documentation

## 2015-06-26 DIAGNOSIS — E039 Hypothyroidism, unspecified: Secondary | ICD-10-CM | POA: Diagnosis not present

## 2015-06-26 DIAGNOSIS — Z87891 Personal history of nicotine dependence: Secondary | ICD-10-CM | POA: Diagnosis not present

## 2015-06-26 DIAGNOSIS — K222 Esophageal obstruction: Secondary | ICD-10-CM | POA: Diagnosis not present

## 2015-06-26 DIAGNOSIS — K259 Gastric ulcer, unspecified as acute or chronic, without hemorrhage or perforation: Secondary | ICD-10-CM | POA: Diagnosis not present

## 2015-06-26 DIAGNOSIS — Z79899 Other long term (current) drug therapy: Secondary | ICD-10-CM | POA: Insufficient documentation

## 2015-06-26 HISTORY — DX: Anemia, unspecified: D64.9

## 2015-06-26 HISTORY — PX: ESOPHAGOGASTRODUODENOSCOPY (EGD) WITH PROPOFOL: SHX5813

## 2015-06-26 HISTORY — DX: Reserved for inherently not codable concepts without codable children: IMO0001

## 2015-06-26 SURGERY — ESOPHAGOGASTRODUODENOSCOPY (EGD) WITH PROPOFOL
Anesthesia: Monitor Anesthesia Care

## 2015-06-26 MED ORDER — STERILE WATER FOR IRRIGATION IR SOLN
Status: DC | PRN
  Administered 2015-06-26: 5 mL

## 2015-06-26 MED ORDER — LACTATED RINGERS IV SOLN
INTRAVENOUS | Status: DC
  Administered 2015-06-26: 10:00:00 via INTRAVENOUS

## 2015-06-26 MED ORDER — GLYCOPYRROLATE 0.2 MG/ML IJ SOLN
INTRAMUSCULAR | Status: DC | PRN
  Administered 2015-06-26: 0.2 mg via INTRAVENOUS

## 2015-06-26 MED ORDER — LACTATED RINGERS IV SOLN: INTRAVENOUS | Status: DC

## 2015-06-26 MED ORDER — PROPOFOL 10 MG/ML IV BOLUS
INTRAVENOUS | Status: DC | PRN
Start: 2015-06-26 — End: 2015-06-26
  Administered 2015-06-26: 30 mg via INTRAVENOUS
  Administered 2015-06-26: 20 mg via INTRAVENOUS
  Administered 2015-06-26: 60 mg via INTRAVENOUS
  Administered 2015-06-26: 20 mg via INTRAVENOUS

## 2015-06-26 MED ORDER — LIDOCAINE HCL (CARDIAC) 20 MG/ML IV SOLN
INTRAVENOUS | Status: DC | PRN
  Administered 2015-06-26: 50 mg via INTRAVENOUS

## 2015-06-26 SURGICAL SUPPLY — 39 items
BALLN DILATOR 10-12 8 (BALLOONS)
BALLN DILATOR 12-15 8 (BALLOONS)
BALLN DILATOR 15-18 8 (BALLOONS) ×3
BALLN DILATOR CRE 0-12 8 (BALLOONS)
BALLN DILATOR ESOPH 8 10 CRE (MISCELLANEOUS) IMPLANT
BALLOON DILATOR 12-15 8 (BALLOONS) IMPLANT
BALLOON DILATOR 15-18 8 (BALLOONS) ×1 IMPLANT
BALLOON DILATOR CRE 0-12 8 (BALLOONS) IMPLANT
BLOCK BITE 60FR ADLT L/F GRN (MISCELLANEOUS) ×3 IMPLANT
CANISTER SUCT 1200ML W/VALVE (MISCELLANEOUS) ×3 IMPLANT
FCP ESCP3.2XJMB 240X2.8X (MISCELLANEOUS)
FORCEPS BIOP RAD 4 LRG CAP 4 (CUTTING FORCEPS) IMPLANT
FORCEPS BIOP RJ4 240 W/NDL (MISCELLANEOUS)
FORCEPS ESCP3.2XJMB 240X2.8X (MISCELLANEOUS) IMPLANT
GOWN CVR UNV OPN BCK APRN NK (MISCELLANEOUS) ×2 IMPLANT
GOWN ISOL THUMB LOOP REG UNIV (MISCELLANEOUS) ×4
HEMOCLIP INSTINCT (CLIP) IMPLANT
INJECTOR VARIJECT VIN23 (MISCELLANEOUS) IMPLANT
KIT CO2 TUBING (TUBING) IMPLANT
KIT DEFENDO VALVE AND CONN (KITS) IMPLANT
KIT ENDO PROCEDURE OLY (KITS) ×3 IMPLANT
LIGATOR MULTIBAND 6SHOOTER MBL (MISCELLANEOUS) IMPLANT
MARKER SPOT ENDO TATTOO 5ML (MISCELLANEOUS) IMPLANT
PAD GROUND ADULT SPLIT (MISCELLANEOUS) IMPLANT
SNARE SHORT THROW 13M SML OVAL (MISCELLANEOUS) IMPLANT
SNARE SHORT THROW 30M LRG OVAL (MISCELLANEOUS) IMPLANT
SPOT EX ENDOSCOPIC TATTOO (MISCELLANEOUS)
SUCTION POLY TRAP 4CHAMBER (MISCELLANEOUS) IMPLANT
SYR INFLATION 60ML (SYRINGE) ×3 IMPLANT
TRAP SUCTION POLY (MISCELLANEOUS) IMPLANT
TUBING CONN 6MMX3.1M (TUBING)
TUBING SUCTION CONN 0.25 STRL (TUBING) IMPLANT
UNDERPAD 30X60 958B10 (PK) (MISCELLANEOUS) IMPLANT
VALVE BIOPSY ENDO (VALVE) IMPLANT
VARIJECT INJECTOR VIN23 (MISCELLANEOUS)
WATER AUXILLARY (MISCELLANEOUS) IMPLANT
WATER STERILE IRR 250ML POUR (IV SOLUTION) ×3 IMPLANT
WATER STERILE IRR 500ML POUR (IV SOLUTION) IMPLANT
WIRE CRE 18-20MM 8CM F G (MISCELLANEOUS) IMPLANT

## 2015-06-26 NOTE — H&P (Signed)
[facility]  [address], [address] [place], [place] [address] Phone: [phone] Fax : [phone]  Primary Care Physician:  Lorie Phenix, MD Primary Gastroenterologist:  Dr. Servando Snare  Pre-Procedure History & Physical: HPI:  Kerry Stone is a 69 y.o. female is here for an endoscopy.   Past Medical History  Diagnosis Date  . Allergy   . Hypertension   . GERD (gastroesophageal reflux disease)   . Arthritis   . Anxiety   . Hyperlipidemia   . BP (high blood pressure) 07/11/2002  . Diverticulitis of colon 11/09/2006  . 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 Shortness of breath dyspnea     only when anemic  . Anemia     Hx    Past Surgical History  Procedure Laterality Date  . Finger fracture surgery Left 1984    thumb  . Finger surgery      bone spur and cyst repair. Dr. Cathie Beams at [facility]  . Breast biopsy Right   . Colonoscopy  11/2014    [facility], Dr. Servando Snare    Prior to Admission medications   Medication Sig Start Date End Date Taking? Authorizing Provider  ALPRAZolam Prudy Feeler) 0.5 MG tablet Take 1 tablet (0.5 mg total) by mouth at bedtime. 05/05/15  Yes Lorie Phenix, MD  chlorthalidone (HYGROTON) 25 MG tablet Take 25 mg by mouth.  02/17/15  Yes Historical Provider, MD  desonide (DESOWEN) 0.05 % cream Apply topically.    Yes Historical Provider, MD  escitalopram (LEXAPRO) 10 MG tablet Take 5 mg by mouth daily.  02/04/15  Yes Historical Provider, MD  estradiol (ESTRACE) 0.1 MG/GM vaginal cream Place vaginally.    Yes Historical Provider, MD  fluticasone (CUTIVATE) 0.005 % ointment continuously as needed. 01/27/10  Yes Historical Provider, MD  ibuprofen (ADVIL,MOTRIN) 200 MG tablet as needed.   Yes Historical Provider, MD  lisinopril (PRINIVIL,ZESTRIL) 2.5 MG tablet TAKE 1 TABLET BY MOUTH DAILY 06/10/15  Yes Lorie Phenix, MD  Loratadine 10 MG CAPS Take by mouth as needed.   Yes Historical Provider, MD  omeprazole (PRILOSEC) 20 MG capsule TAKE 1 CAPSULE BY MOUTH DAILY  04/02/15  Yes Lorie Phenix, MD  simvastatin (ZOCOR) 20 MG tablet TAKE 1 TABLET BY MOUTH EVERY DAY 04/14/15  Yes Lorie Phenix, MD  ferrous sulfate 325 (65 FE) MG tablet Take 1 tablet (325 mg total) by mouth 2 (two) times daily with a meal. Patient not taking: Reported on 06/26/2015 05/19/15   Lorie Phenix, MD  ranitidine (ZANTAC) 300 MG tablet Take 300 mg by mouth at bedtime.    Historical Provider, MD    Allergies as of 06/25/2015  . (No Known Allergies)    Family History  Problem Relation Age of Onset  . Diabetes Mother   . Hypertension Mother   . Anuerysm Mother   . Hypertension Father   . Hyperlipidemia Father     Social History   Social History  . Marital Status: Married    Spouse Name: Cordelia Pen  . Number of Children: 1  . Years of Education: N/A   Occupational History  . Not on file.   Social History Main Topics  . Smoking status: Former Games developer  . Smokeless tobacco: Never Used     Comment: quit 30+ yrs ago  . Alcohol Use: 0.6 oz/week    1 Cans of beer per week     Comment: occasional  . Drug Use: No  . Sexual Activity: Not on file   Other Topics Concern  . Not  on file   Social History Narrative    Review of Systems: See HPI, otherwise negative ROS  Physical Exam: BP 104/83 mmHg  Pulse 86  Temp(Src) 97.5 F (36.4 C) (Temporal)  Resp 16  Ht  (1.6 m)  Wt 166 lb (75.297 kg)  BMI 29.41 kg/m2  SpO2 99% General:   Alert,  pleasant and cooperative in NAD Head:  Normocephalic and atraumatic. Neck:  Supple; no masses or thyromegaly. Lungs:  Clear throughout to auscultation.    Heart:  Regular rate and rhythm. Abdomen:  Soft, nontender and nondistended. Normal bowel sounds, without guarding, and without rebound.   Neurologic:  Alert and  oriented x4;  grossly normal neurologically.  Impression/Plan: Kerry Stone is here for an endoscopy to be performed for GERD  Risks, benefits, limitations, and alternatives regarding  endoscopy have been reviewed  with the patient.  Questions have been answered.  All parties agreeable.   Darlina Rumpf, MD  06/26/2015, 10:10 AM

## 2015-06-26 NOTE — Discharge Instructions (Signed)

## 2015-06-26 NOTE — Anesthesia Preprocedure Evaluation (Signed)
Anesthesia Evaluation   Patient awake    Reviewed: Allergy & Precautions, H&P , Patient's Chart, lab work & pertinent test results  Airway Mallampati: II  TM Distance: >3 FB Neck ROM: full    Dental no notable dental hx.    Pulmonary shortness of breath, former smoker,  SOB thought 2/2 anemia.   Pulmonary exam normal        Cardiovascular hypertension, Normal cardiovascular exam     Neuro/Psych    GI/Hepatic Neg liver ROS, GERD  Controlled and Medicated,  Endo/Other  Hypothyroidism   Renal/GU negative Renal ROS     Musculoskeletal   Abdominal   Peds  Hematology  (+) anemia ,   Anesthesia Other Findings   Reproductive/Obstetrics negative OB ROS                             Anesthesia Physical Anesthesia Plan  ASA: II  Anesthesia Plan: MAC   Post-op Pain Management:    Induction:   Airway Management Planned:   Additional Equipment:   Intra-op Plan:   Post-operative Plan:   Informed Consent: I have reviewed the patients History and Physical, chart, labs and discussed the procedure including the risks, benefits and alternatives for the proposed anesthesia with the patient or authorized representative who has indicated his/her understanding and acceptance.     Plan Discussed with: CRNA  Anesthesia Plan Comments:         Anesthesia Quick Evaluation

## 2015-06-26 NOTE — Anesthesia Procedure Notes (Signed)
Procedure Name: MAC Performed by: AMYOT, MICHAEL Pre-anesthesia Checklist: Patient identified, Emergency Drugs available, Suction available, Timeout performed and Patient being monitored Patient Re-evaluated:Patient Re-evaluated prior to inductionOxygen Delivery Method: Nasal cannula Placement Confirmation: positive ETCO2       

## 2015-06-26 NOTE — Op Note (Signed)
[facility] Gastroenterology Patient Name: Kerry Stone Procedure Date: 06/26/2015 11:18 AM MRN: [id number] Account #: [id number] Date of Birth: [birth year] Admit Type: Outpatient Age: 69 Room: [facility] OR ROOM 01 Gender: Female Note Status: Finalized Procedure:         Upper GI endoscopy Indications:       Dysphagia Providers:         Midge Minium, MD Referring MD:      Leo Grosser, MD (Referring MD) Medicines:         Propofol per Anesthesia Complications:     No immediate complications. Procedure:         Pre-Anesthesia Assessment:                    - Prior to the procedure, a History and Physical was                     performed, and patient medications and allergies were                     reviewed. The patient's tolerance of previous anesthesia                     was also reviewed. The risks and benefits of the procedure                     and the sedation options and risks were discussed with the                     patient. All questions were answered, and informed consent                     was obtained. Prior Anticoagulants: The patient has taken                     no previous anticoagulant or antiplatelet agents. ASA                     Grade Assessment: II - A patient with mild systemic                     disease. After reviewing the risks and benefits, the                     patient was deemed in satisfactory condition to undergo                     the procedure.                    After obtaining informed consent, the endoscope was passed                     under direct vision. Throughout the procedure, the                     patient's blood pressure, pulse, and oxygen saturations                     were monitored continuously. The was introduced through                     the mouth, and advanced to the second part of duodenum.  The upper GI endoscopy was accomplished without                     difficulty. The  patient tolerated the procedure well. Findings:      A large hiatus hernia was present.      A benign-appearing, intrinsic mild stenosis was found at the       gastroesophageal junction and was traversed. A TTS dilator was passed       through the scope. Dilation with a 15-16.5-18 mm balloon (to a maximum       balloon size of 18 mm) dilator was performed.      The stomach was normal.      The examined duodenum was normal.      A small hiatus hernia with a few Cameron ulcers was found. The proximal       extent of the gastric folds (end of tubular esophagus) was at the       gastroesophageal junction. Impression:        - Large hiatus hernia.                    - Benign-appearing esophageal stricture. Dilated.                    - Normal stomach.                    - Normal examined duodenum.                    - Small hiatus hernia with a few Cameron ulcers.                    - No specimens collected. Recommendation:    - Continue present medications. Procedure Code(s): --- Professional ---                    [phone], Esophagogastroduodenoscopy, flexible, transoral;                     with transendoscopic balloon dilation of esophagus (less                     than 30 mm diameter) Diagnosis Code(s): --- Professional ---                    R13.10, Dysphagia, unspecified                    K44.9, Diaphragmatic hernia without obstruction or gangrene                    K22.2, Esophageal obstruction CPT copyright 2014 American Medical Association. All rights reserved. The codes documented in this report are preliminary and upon coder review may  be revised to meet current compliance requirements. Midge Minium, MD 06/26/2015 11:33:42 AM This report has been signed electronically. Number of Addenda: 0 Note Initiated On: 06/26/2015 11:18 AM Total Procedure Duration: 0 hours 4 minutes 50 seconds       [facility]

## 2015-06-26 NOTE — Anesthesia Postprocedure Evaluation (Signed)
  Anesthesia Post-op Note  Patient: Kerry Stone  Procedure(s) Performed: Procedure(s): ESOPHAGOGASTRODUODENOSCOPY (EGD) WITH PROPOFOL (N/A)  Anesthesia type:MAC  Patient location: PACU  Post pain: Pain level controlled  Post assessment: Post-op Vital signs reviewed, Patient's Cardiovascular Status Stable, Respiratory Function Stable, Patent Airway and No signs of Nausea or vomiting  Post vital signs: Reviewed and stable  Last Vitals:  Filed Vitals:   06/26/15 1200  BP:   Pulse: 80  Temp:   Resp: 18    Level of consciousness: awake, alert  and patient cooperative  Complications: No apparent anesthesia complications

## 2015-06-26 NOTE — Transfer of Care (Signed)
Immediate Anesthesia Transfer of Care Note  Patient: Kerry Stone  Procedure(s) Performed: Procedure(s): ESOPHAGOGASTRODUODENOSCOPY (EGD) WITH PROPOFOL (N/A)  Patient Location: PACU  Anesthesia Type: MAC  Level of Consciousness: awake, alert  and patient cooperative  Airway and Oxygen Therapy: Patient Spontanous Breathing and Patient connected to supplemental oxygen  Post-op Assessment: Post-op Vital signs reviewed, Patient's Cardiovascular Status Stable, Respiratory Function Stable, Patent Airway and No signs of Nausea or vomiting  Post-op Vital Signs: Reviewed and stable  Complications: No apparent anesthesia complications

## 2015-07-01 ENCOUNTER — Telehealth: Payer: Self-pay | Admitting: Gastroenterology

## 2015-07-01 NOTE — Telephone Encounter (Signed)
Contacted Central Scheduling to make sure they have received order for US/HIDA scan. Scheduling contacted pt and scheduled scan for 07-08-15.

## 2015-07-01 NOTE — Telephone Encounter (Signed)
Patient said she was suppose to be contacted for a ultrasound, And she was calling to let us know she has not heard anything about that

## 2015-07-05 ENCOUNTER — Ambulatory Visit (INDEPENDENT_AMBULATORY_CARE_PROVIDER_SITE_OTHER): Payer: Medicare Other

## 2015-07-05 ENCOUNTER — Other Ambulatory Visit (INDEPENDENT_AMBULATORY_CARE_PROVIDER_SITE_OTHER): Payer: Medicare Other | Admitting: Family Medicine

## 2015-07-05 DIAGNOSIS — Z23 Encounter for immunization: Secondary | ICD-10-CM | POA: Diagnosis not present

## 2015-07-08 ENCOUNTER — Ambulatory Visit
Admission: RE | Admit: 2015-07-08 | Discharge: 2015-07-08 | Disposition: A | Discharge status: Home / Self Care | Payer: Medicare Other | Source: Ambulatory Visit | Attending: Gastroenterology | Admitting: Gastroenterology

## 2015-07-08 DIAGNOSIS — R1084 Generalized abdominal pain: Secondary | ICD-10-CM

## 2015-07-08 MED ORDER — SINCALIDE 5 MCG IJ SOLR
0.0200 ug/kg | Freq: Once | INTRAMUSCULAR | Status: AC
Start: 2015-07-08 — End: 2015-07-08
  Administered 2015-07-08: 1.5 ug via INTRAVENOUS
  Filled 2015-07-08: qty 5

## 2015-07-08 MED ORDER — TECHNETIUM TC 99M MEBROFENIN IV KIT
5.0000 | PACK | Freq: Once | INTRAVENOUS | Status: DC | PRN
  Administered 2015-07-08: 5.35 via INTRAVENOUS
  Filled 2015-07-08: qty 6

## 2015-07-09 ENCOUNTER — Telehealth: Payer: Self-pay

## 2015-07-09 NOTE — Telephone Encounter (Signed)
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 

## 2015-07-09 NOTE — Telephone Encounter (Signed)
Pt notified of results from US and HIDA and Dr. Annabell SabalWohl's recommendation to consult with a surgeon. [place] and will call back when they return to schedule [facility].

## 2015-07-09 NOTE — Telephone Encounter (Signed)
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

## 2015-07-09 NOTE — Telephone Encounter (Signed)
LVM for pt to return my call.

## 2015-07-17 ENCOUNTER — Ambulatory Visit: Payer: Self-pay | Admitting: Surgery

## 2015-07-30 ENCOUNTER — Encounter: Payer: Self-pay | Admitting: General Surgery

## 2015-07-30 ENCOUNTER — Ambulatory Visit (INDEPENDENT_AMBULATORY_CARE_PROVIDER_SITE_OTHER): Payer: Medicare Other | Admitting: General Surgery

## 2015-07-30 ENCOUNTER — Other Ambulatory Visit: Payer: Self-pay | Admitting: *Deleted

## 2015-07-30 VITALS — BP 163/93 | HR 44 | Temp 97.5°F | Ht 63.0 in | Wt 165.0 lb

## 2015-07-30 DIAGNOSIS — K219 Gastro-esophageal reflux disease without esophagitis: Secondary | ICD-10-CM

## 2015-07-30 DIAGNOSIS — K449 Diaphragmatic hernia without obstruction or gangrene: Secondary | ICD-10-CM

## 2015-07-30 DIAGNOSIS — K828 Other specified diseases of gallbladder: Secondary | ICD-10-CM

## 2015-07-30 NOTE — Patient Instructions (Signed)
[facility].  If you have any questions, please call our office at your earliest convenience.

## 2015-07-30 NOTE — Progress Notes (Signed)
Patient ID: Kerry Stone, female   DOB: 11/23/1945, 69 y.o.   MRN: [id number] CC: ABDOMINAL PAIN HPI Kerry Stone is a 69 y.o. female presents to clinic today for evaluation of 2 separate problems. Her primary consultation is for biliary dyskinesia with a secondary consultation for hiatal hernia. Patient reports that over the last several years she has a long-standing history of abdominal pain after eating. Pain usually starts between immediately to 30 minutes after eating and last for a couple hours. Pain has been throbbing in nature in the right upper quadrant and in the midepigastric region. She states that recently she has altered her diet which has decreased the frequency and intensity of pain. She states that eating 4-5 small meals a day that the pain is minimal. She also states that in order for sleeping at night she has been propping up her bed to keep her head elevated as well as making sure not eat too close to bedtime. She thinks fatty or spicy foods have worsen the pain however is not entirely clear. She denies any fevers, chills, diarrhea, constipation. Although she does have intermittent chest pain, reflux, acid the mouth, right upper quadrant abdominal pain, nausea but no vomiting. She's been evaluated by GI for this and was sent here for evaluation.  HPI  Past Medical History  Diagnosis Date  . Allergy   . Hypertension   . GERD (gastroesophageal reflux disease)   . Arthritis   . Anxiety   . Hyperlipidemia   . BP (high blood pressure) 07/11/2002  . Diverticulitis of colon 11/09/2006  . Shortness of breath dyspnea     only when anemic  . Anemia     Hx    Past Surgical History  Procedure Laterality Date  . Finger fracture surgery Left 1984    thumb  . Finger surgery      [facility]  . Breast biopsy Right   . Colonoscopy  11/2014    [facility], Dr. Servando SnareWohl  . Esophagogastroduodenoscopy (egd) with propofol N/A 06/26/2015    Procedure:  ESOPHAGOGASTRODUODENOSCOPY (EGD) WITH PROPOFOL;  Surgeon: Midge Miniumarren Wohl, MD;  [facility];  Service: Endoscopy;  Laterality: N/A;    Family History  Problem Relation Age of Onset  . Diabetes Mother   . Hypertension Mother   . Anuerysm Mother   . Hypertension Father   . Hyperlipidemia Father     Social History Social History  Substance Use Topics  . Smoking status: Former Smoker    Quit date: 02/25/1982  . Smokeless tobacco: Never Used     Comment: quit 30+ yrs ago  . Alcohol Use: No     Comment: occasional    No Known Allergies  Current Outpatient Prescriptions  Medication Sig Dispense Refill  . ALPRAZolam (XANAX) 0.5 MG tablet Take 1 tablet (0.5 mg total) by mouth at bedtime. (Patient taking differently: Take 0.5 mg by mouth at bedtime as needed. ) 30 tablet 5  . chlorthalidone (HYGROTON) 25 MG tablet Take 25 mg by mouth.     . desonide (DESOWEN) 0.05 % ointment     . escitalopram (LEXAPRO) 10 MG tablet Take 5 mg by mouth daily.     Marland Kitchen. estradiol (ESTRACE) 0.1 MG/GM vaginal cream Place vaginally.     . fluticasone (CUTIVATE) 0.005 % ointment continuously as needed.    Marland Kitchen. ibuprofen (ADVIL,MOTRIN) 200 MG tablet as needed.    Marland Kitchen. lisinopril (PRINIVIL,ZESTRIL) 2.5 MG tablet TAKE 1  TABLET BY MOUTH DAILY 90 tablet 3  . Loratadine 10 MG CAPS Take by mouth as needed.    Marland Kitchen omeprazole (PRILOSEC) 20 MG capsule TAKE 1 CAPSULE BY MOUTH DAILY 90 capsule 3  . simvastatin (ZOCOR) 20 MG tablet TAKE 1 TABLET BY MOUTH EVERY DAY 90 tablet 3   No current facility-administered medications for this visit.     Review of Systems A multipoint review of systems was completed. All pertinent positives and negatives within the history of present illness remainder were negative.  Physical Exam Blood pressure 163/93, pulse 44, temperature 97.5 F (36.4 C), temperature source Oral, height  (1.6 m), weight 74.844 kg (165 lb). CONSTITUTIONAL: Appropriate and in no acute  distress.Marland Kitchen EYES: Pupils are equal, round, and reactive to light, Sclera are non-icteric. EARS, NOSE, MOUTH AND THROAT: The oropharynx is clear. The oral mucosa is pink and moist. Hearing is intact to voice. LYMPH NODES:  Lymph nodes in the neck are normal. RESPIRATORY:  Lungs are clear. There is normal respiratory effort, with equal breath sounds bilaterally, and without pathologic use of accessory muscles. CARDIOVASCULAR: Heart is regular without murmurs, gallops, or rubs. There are audible borborygmi during auscultation of the heart. GI: The abdomen is soft, nontender, and nondistended. There are no palpable masses. There is no hepatosplenomegaly. There are normal bowel sounds in all quadrants. GU: Rectal deferred.   MUSCULOSKELETAL: Normal muscle strength and tone. No cyanosis or edema.   SKIN: Turgor is good and there are no pathologic skin lesions or ulcers. NEUROLOGIC: Motor and sensation is grossly normal. Cranial nerves are grossly intact. PSYCH:  Oriented to person, place and time. Affect is normal.  Data Reviewed I personally reviewed her images and prior endoscopy. Biliary dyskinesia with a decreased ejection fraction to 18%. No evidence of cholelithiasis. Hiatal hernia present on EGD with small ulcerations as well. I have personally reviewed the patient's imaging, laboratory findings and medical records.    Assessment    69 year old female with both biliary dyskinesia and symptomatic hiatal hernia.    Plan    Had a long conversation with the patient about both of her surgical problems. Specifically for biliary dyskinesia discussed treatment with a laparoscopic cholecystectomy with both the risks and benefits. The benefits being potential relief of pain however risk being potential continuation of pain as her ejection fraction is depressed but the study did not cause symptoms. No guarantees were made for symptom relief. Patient voiced understanding and wished to take a wait and see  approach at this time.  Another long conversation was carried out for discussion about hiatal hernia repair. The procedure itself was described in detail to include the risks, benefits, alternatives. Patient was very interested as she has been very symptomatic and had to have severe lifestyle alterations. If she deviates from this lifestyle alterations she has immediate return of symptoms. Discussed that prior to any surgical intervention she would need additional studies performed to include esophageal manometry and probable probe. These could be accomplished by gastroenterology and an outpatient setting.  After completing these conversations discussed that it is possible to do both a cholecystectomy and hiatal hernia repair in the same operative setting. She and her partner voiced extreme interest in this. We will proceed with continued workup of hiatal hernia. Planning for surgical management will await completion of this workup. This will also allow her partner to continue her workup for her potential recurrent breast cancer. Plan return to clinic after completing GI workup.  Time spent with the patient was 60 minutes, with more than 50% of the time spent in face-to-face education, counseling and care coordination.     Ricarda Frame 07/30/2015, 10:14 AM

## 2015-07-31 ENCOUNTER — Telehealth: Payer: Self-pay

## 2015-07-31 NOTE — Telephone Encounter (Signed)
Patient has an appointment for a consult at [facility] on 08/11/2015 with Janett Labellaary Richards, PA at 11:00 AM. Patient will need to be seen first in order to schedule Manometry with PH and BRAVO Probe.

## 2015-08-08 ENCOUNTER — Other Ambulatory Visit: Payer: Self-pay | Admitting: Family Medicine

## 2015-08-08 DIAGNOSIS — I10 Essential (primary) hypertension: Secondary | ICD-10-CM

## 2015-08-25 ENCOUNTER — Encounter: Payer: Self-pay | Admitting: Family Medicine

## 2015-08-25 ENCOUNTER — Ambulatory Visit (INDEPENDENT_AMBULATORY_CARE_PROVIDER_SITE_OTHER): Payer: Medicare Other | Admitting: Family Medicine

## 2015-08-25 VITALS — BP 124/70 | HR 80 | Temp 97.7°F | Resp 16 | Wt 170.0 lb

## 2015-08-25 DIAGNOSIS — K449 Diaphragmatic hernia without obstruction or gangrene: Secondary | ICD-10-CM | POA: Diagnosis not present

## 2015-08-25 DIAGNOSIS — I10 Essential (primary) hypertension: Secondary | ICD-10-CM | POA: Diagnosis not present

## 2015-08-25 DIAGNOSIS — K219 Gastro-esophageal reflux disease without esophagitis: Secondary | ICD-10-CM

## 2015-08-25 DIAGNOSIS — F419 Anxiety disorder, unspecified: Secondary | ICD-10-CM

## 2015-08-25 MED ORDER — ESCITALOPRAM OXALATE 10 MG PO TABS: 10.0000 mg | ORAL_TABLET | Freq: Every day | ORAL | Status: DC

## 2015-08-25 MED ORDER — ALPRAZOLAM 0.5 MG PO TABS: 0.5000 mg | ORAL_TABLET | Freq: Every day | ORAL | Status: DC

## 2015-08-25 NOTE — Progress Notes (Signed)
Patient ID: Kerry Stone, female   DOB: 09/12/1946, 69 y.o.   MRN: [id number]         Patient: Kerry LoganJanice K Stone Female    DOB: 09/12/1946   69 y.o.   MRN: [id number] Visit Date: 08/25/2015  Today's Provider: Lorie PhenixNancy Amarisa Wilinski, MD   Chief Complaint  Patient presents with  . Anxiety  . Hypertension   Subjective:    Anxiety Presents for follow-up visit. The problem has been gradually worsening (Pt's partner is out of remission. Increased back to a whole tab. ). Symptoms include excessive worry and nervous/anxious behavior. Patient reports no chest pain, confusion, decreased concentration, insomnia, nausea, palpitations, panic, restlessness, shortness of breath or suicidal ideas. The symptoms are aggravated by family issues.   Compliance with medications is 76-100%. Treatment side effects: Partner going for new treatment today.    Hypertension This is a chronic problem. The problem is unchanged. The problem is controlled. Associated symptoms include anxiety. Pertinent negatives include no chest pain, palpitations or shortness of breath. There are no compliance problems.   Gastroesophageal Reflux She reports no chest pain or no nausea. This is a chronic problem. Associated symptoms include fatigue. Risk factors include hiatal hernia. She has tried a PPI for the symptoms. Past procedures include an EGD. So large may need surgery eventually.  .   Gallbladder functioning at 18 percent.  Does not want the gallbladder out right now.   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  Does feel better on iron.  Wants to continue the iron.     No Known Allergies Previous Medications   ALPRAZOLAM (XANAX) 0.5 MG TABLET    Take 1 tablet (0.5 mg total) by mouth at bedtime.   CHLORTHALIDONE (HYGROTON) 25 MG TABLET    TAKE 1 TABLET BY MOUTH EVERY DAY   DESONIDE (DESOWEN) 0.05 % OINTMENT       ESCITALOPRAM (LEXAPRO) 10 MG TABLET    Take 5 mg by mouth daily.    ESTRADIOL (ESTRACE) 0.1 MG/GM VAGINAL CREAM    Place  vaginally.    FERROUS SULFATE 325 (65 FE) MG TABLET    Take 325 mg by mouth 2 (two) times daily with a meal.   FLUTICASONE (CUTIVATE) 0.005 % OINTMENT    continuously as needed.   IBUPROFEN (ADVIL,MOTRIN) 200 MG TABLET    as needed.   LISINOPRIL (PRINIVIL,ZESTRIL) 2.5 MG TABLET    TAKE 1 TABLET BY MOUTH DAILY   LORATADINE 10 MG CAPS    Take by mouth as needed.   OMEPRAZOLE (PRILOSEC) 20 MG CAPSULE    TAKE 1 CAPSULE BY MOUTH DAILY   SIMVASTATIN (ZOCOR) 20 MG TABLET    TAKE 1 TABLET BY MOUTH EVERY DAY    Review of Systems  Constitutional: Positive for fatigue. Negative for fever, chills, diaphoresis, activity change, appetite change and unexpected weight change.  Respiratory: Negative.  Negative for shortness of breath.   Cardiovascular: Negative.  Negative for chest pain and palpitations.  Gastrointestinal: Negative for nausea.  Psychiatric/Behavioral: Positive for dysphoric mood. Negative for suicidal ideas, hallucinations, behavioral problems, confusion, sleep disturbance, self-injury, decreased concentration and agitation. The patient is nervous/anxious. The patient does not have insomnia and is not hyperactive.     Social History  Substance Use Topics  . Smoking status: Former Smoker    Quit date: 02/25/1982  . Smokeless tobacco: Never Used     Comment: quit 30+ yrs ago  . Alcohol Use: No     Comment: occasional   Objective:  BP 124/70 mmHg  Pulse 80  Temp(Src) 97.7 F (36.5 C) (Oral)  Resp 16  Wt 170 lb (77.111 kg)  Physical Exam  Constitutional: She is oriented to person, place, and time. She appears well-developed and well-nourished.  Cardiovascular: Normal rate and regular rhythm.   Pulmonary/Chest: Effort normal and breath sounds normal.  Neurological: She is alert and oriented to person, place, and time.  Psychiatric: She has a normal mood and affect. Her behavior is normal. Judgment and thought content normal.      Assessment & Plan:     1. Essential  hypertension Stable. Continue current medications. Will continue to monitor.    2. Hiatal hernia Large. Will consider surgery down the road.  Not currently, in light of partner's medical issues.    3. Gastroesophageal reflux disease, esophagitis presence not specified Will continue to Prilosec.  Patient instructed to call back if condition worsens or does not improve.     4. Anxiety Improved on increased medication and recheck as needed.  Patient instructed to call back if condition worsens or does not improve.    - escitalopram (LEXAPRO) 10 MG tablet; Take 1 tablet (10 mg total) by mouth daily.  Dispense: 90 tablet; Refill: 3 - ALPRAZolam (XANAX) 0.5 MG tablet; Take 1 tablet (0.5 mg total) by mouth at bedtime.  Dispense: 30 tablet; Refill: 5     Lorie Phenix, MD  [facility]

## 2015-09-05 ENCOUNTER — Telehealth: Payer: Self-pay | Admitting: Family Medicine

## 2015-09-05 DIAGNOSIS — D649 Anemia, unspecified: Secondary | ICD-10-CM

## 2015-09-05 NOTE — Telephone Encounter (Signed)
Pt stated that she is back in town and is supposed to have her iron levels rechecked but she is leaving to go back out of town on Monday 09/08/15 and would like to get her labs done before she leaves if possible. Pt stated that she understands if she can't get the lab slip. Pt was advised that Dr. Elease HashimotoMaloney is out of the office this afternoon. Thanks TNP

## 2015-09-05 NOTE — Telephone Encounter (Signed)
Ok to print out lab slip but probably too late. Iron and CBC.   Thanks.

## 2015-09-08 NOTE — Telephone Encounter (Signed)
Advised patient as below. Patient reports that she will get labs done later this week. Lab slip printed.

## 2015-11-05 ENCOUNTER — Telehealth (INDEPENDENT_AMBULATORY_CARE_PROVIDER_SITE_OTHER): Payer: Medicare Other | Admitting: General Surgery

## 2015-11-05 DIAGNOSIS — K449 Diaphragmatic hernia without obstruction or gangrene: Secondary | ICD-10-CM

## 2015-11-05 NOTE — Telephone Encounter (Signed)
Returned phone call to patient. Patient explained everything that she was doing at this time to help reduce the symptoms from her hiatal hernia but wanted to know what else could be done and if/when surgery could be done. I explained that she would need to have a PH/Manometry study done prior to surgery and this would take some time as they are booked out for scheduling approximately 6-8 weeks at all times.   She will think about this over night and will call back in the morning if she would like me to schedule manometry.  She is not concerned about the Gallbladder at all at this time as she states that her symptoms are not attributed to this.

## 2015-11-05 NOTE — Telephone Encounter (Signed)
Patient really doesn't want gallbladder surgery since it's major but still hurting. She would like to talk to you to see if by any chance there is something else she can do. She also said Dr. Tonita Cong was awesome

## 2015-11-06 NOTE — Telephone Encounter (Signed)
Spoke with Endo at this time. Patient has been scheduled for PH/Manometry testing on 11/26/15 at [facility]. Patient will need to stop PPI medication 2 days prior to testing and must be NPO at Midnight prior to procedure. Information will be sent regarding testing to patient through MyChart. Patient will also need to call Endo [phone] between 1-3pm the day prior to procedure.

## 2015-11-06 NOTE — Telephone Encounter (Signed)
Sent patient requested post-op information on Mychart for her to review. Will await her call today to find out if she is ready to schedule her testing.

## 2015-11-06 NOTE — Telephone Encounter (Signed)
Patient returned phone call at this time. She would like to proceed with scheduling PH/Manometry. I explained that I would get this scheduled and let her know so that we can move forward with discussing surgery

## 2015-11-07 NOTE — Telephone Encounter (Signed)
Spoke with Dr. Tonita Cong to set-up follow-up from Manometry.  Pt placed on schedule 12/09/15 at 2pm per Dr. Talmage Coin request.  Patient was called and given this information.  Orders placed and faxed over to Endo with 07/30/15 H&P and current medication list per Trish.

## 2015-11-16 ENCOUNTER — Other Ambulatory Visit: Payer: Self-pay | Admitting: Family Medicine

## 2015-11-16 DIAGNOSIS — I10 Essential (primary) hypertension: Secondary | ICD-10-CM

## 2015-11-26 ENCOUNTER — Ambulatory Visit: Admission: RE | Admit: 2015-11-26 | Payer: Medicare Other | Source: Ambulatory Visit | Admitting: Gastroenterology

## 2015-11-26 ENCOUNTER — Encounter: Admission: RE | Payer: Self-pay | Source: Ambulatory Visit

## 2015-11-26 SURGERY — MONITORING, ESOPHAGEAL PH, 24 HOUR

## 2015-12-06 NOTE — Progress Notes (Signed)
Upon looking for results of PH and Manometry studies, testing that was scheduled on 3/1 has been cancelled by patient and was not rescheduled. Will speak with patient if and when she is ready to move forward with surgery.

## 2015-12-09 ENCOUNTER — Ambulatory Visit: Payer: Medicare Other | Admitting: General Surgery

## 2015-12-15 ENCOUNTER — Other Ambulatory Visit: Payer: Self-pay | Admitting: Family Medicine

## 2015-12-15 DIAGNOSIS — F419 Anxiety disorder, unspecified: Secondary | ICD-10-CM

## 2015-12-15 NOTE — Telephone Encounter (Signed)
Printed, please fax or call in to pharmacy. Thank you.   

## 2015-12-19 IMAGING — CT CT ABD-PELV W/ CM
2 of 5 series · 15 of 46 positions shown, 17 images · IV contrast (omnipaque)
Comparison: 11/12/2014 chest x-ray.  No comparison abdominal CT.

CLINICAL DATA: 68-year-old female with recent abnormal chest x-ray.
History of 11/12/2014 chest x-ray was right-sided pain. History of
diverticulitis. Subsequent encounter.

EXAM:
CT ABDOMEN AND PELVIS WITH CONTRAST
TECHNIQUE: Multidetector CT imaging of the abdomen and pelvis was performed
using the standard protocol following bolus administration of
intravenous contrast.
CONTRAST:  100 cc Omnipaque 350.

[Series 2: routine with · axial · 0.64mm/px · z∈[-1100,-715]mm · 12 of 87 slices shown, 14 images]
[im 5/87  soft-tissue]
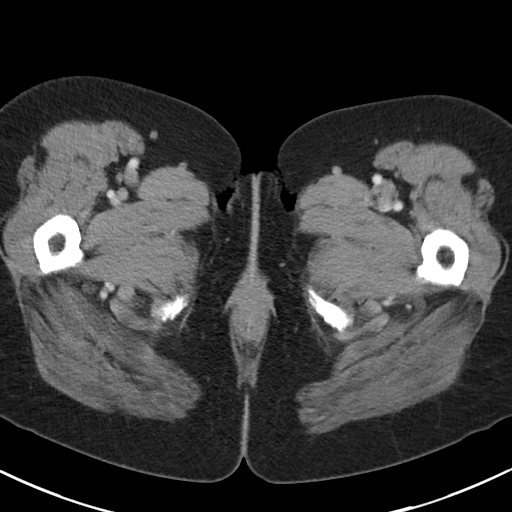
[im 5/87  bone]
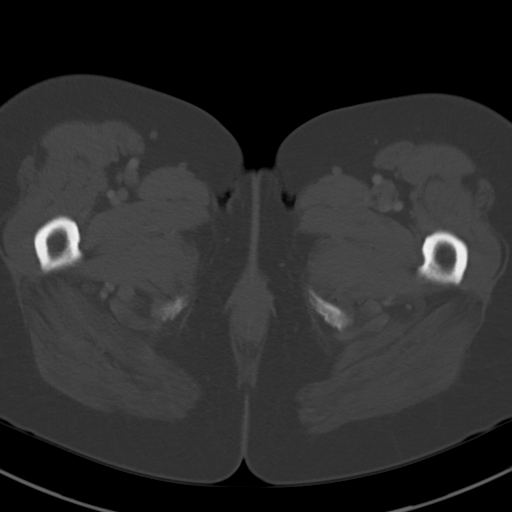
[im 15/87  soft-tissue]
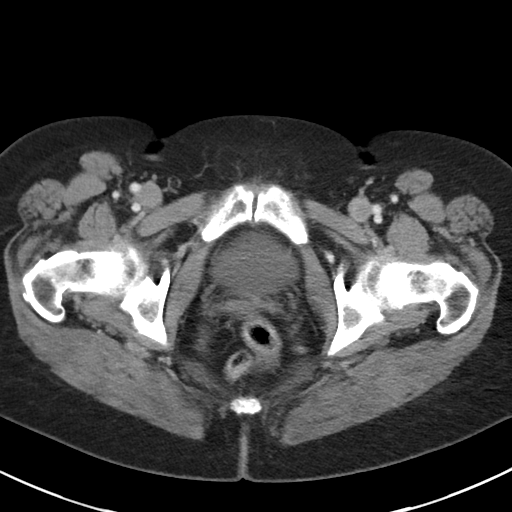
[im 20/87  soft-tissue]
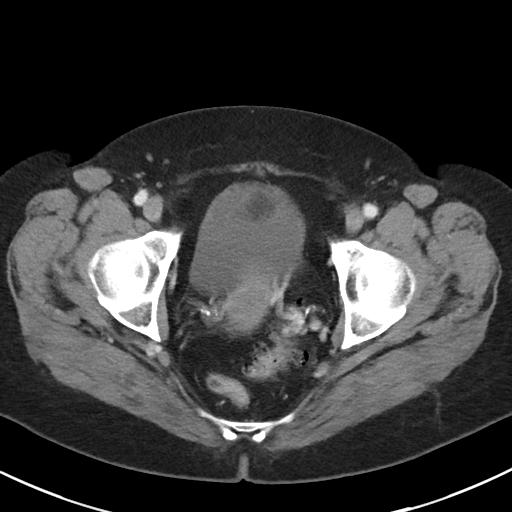
[im 24/87  soft-tissue]
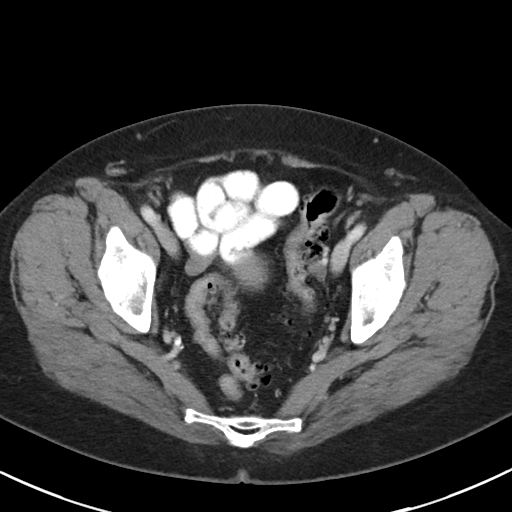
[im 34/87  soft-tissue]
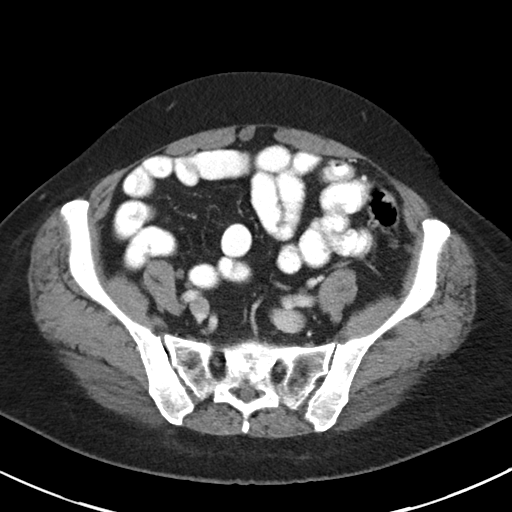
[im 39/87  soft-tissue]
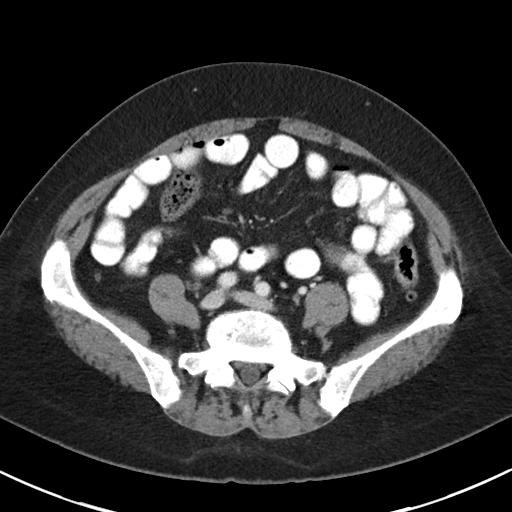
[im 48/87  soft-tissue]
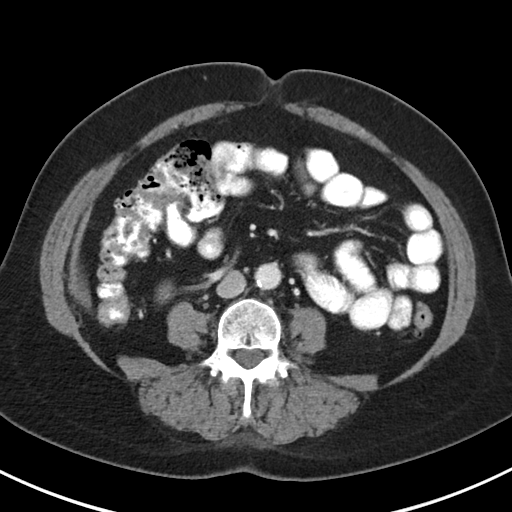
[im 53/87  soft-tissue]
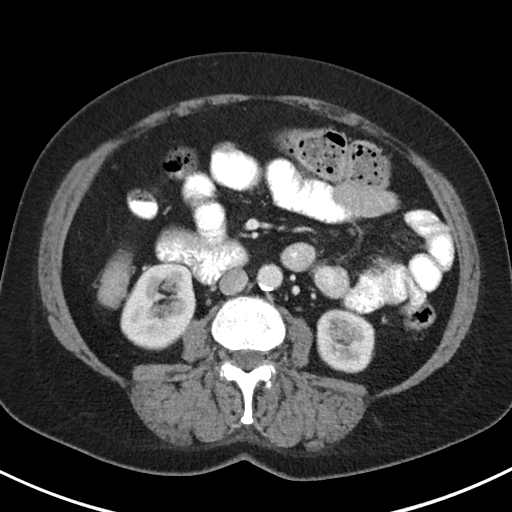
[im 63/87  soft-tissue]
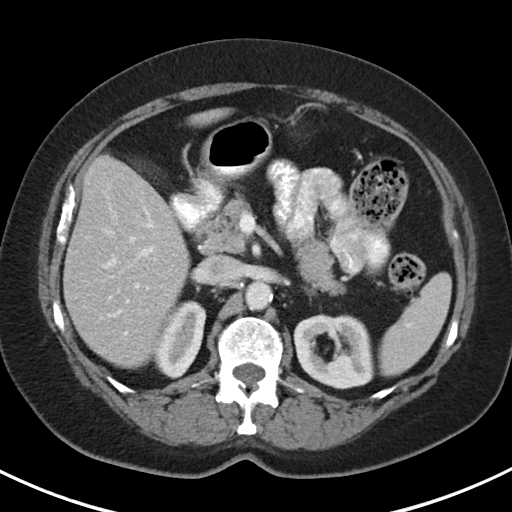
[im 63/87  bone]
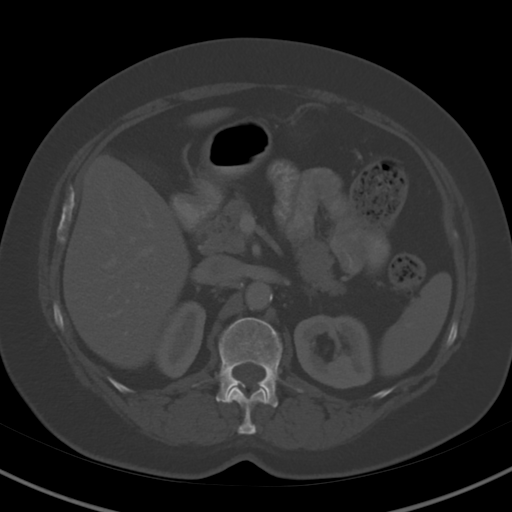
[im 67/87  soft-tissue]
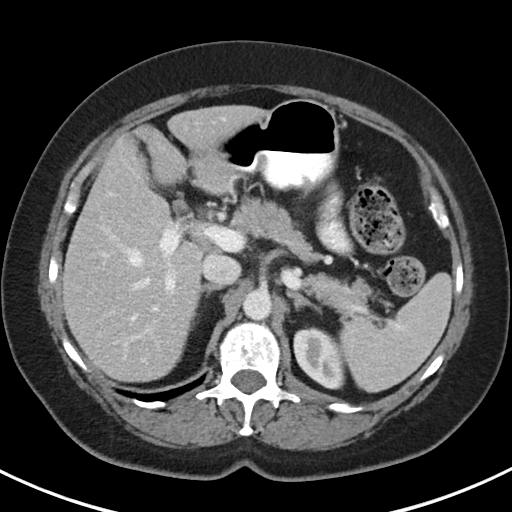
[im 72/87  soft-tissue]
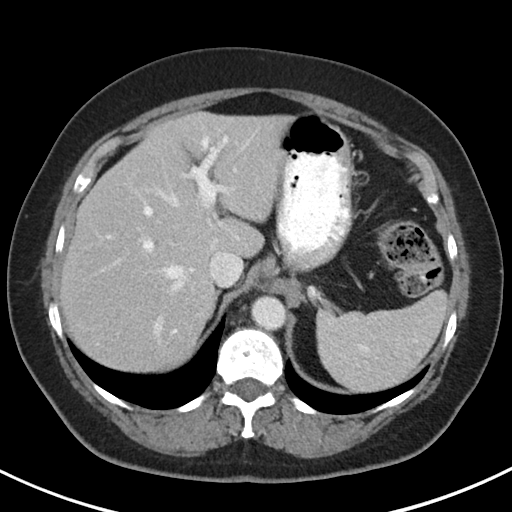
[im 82/87  soft-tissue]
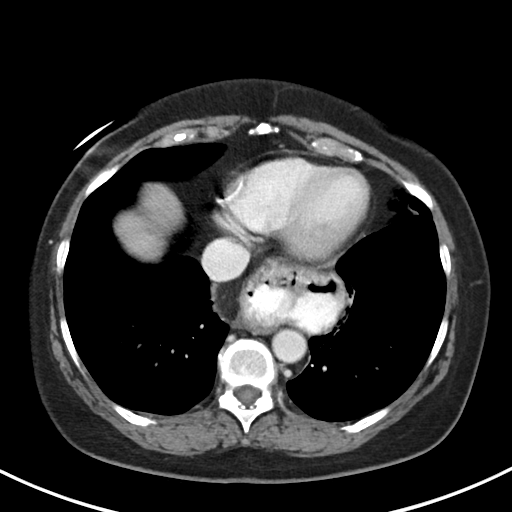

[Series 6: cor routine with · coronal · 0.67mm/px · 3 of 146 slices shown]
[im 49/146  soft-tissue]
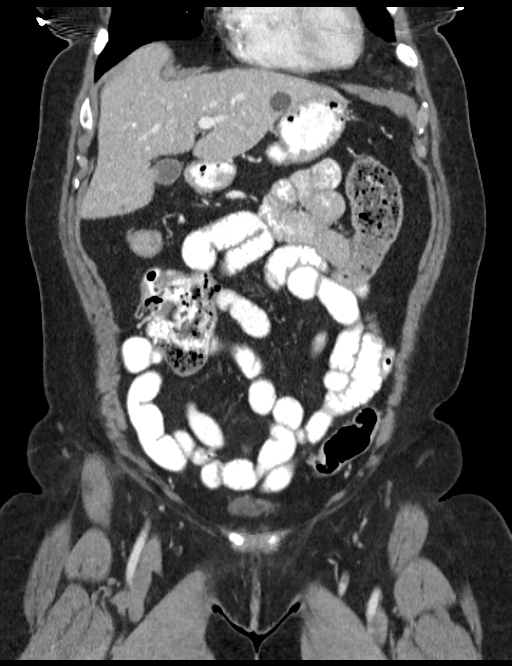
[im 65/146  soft-tissue]
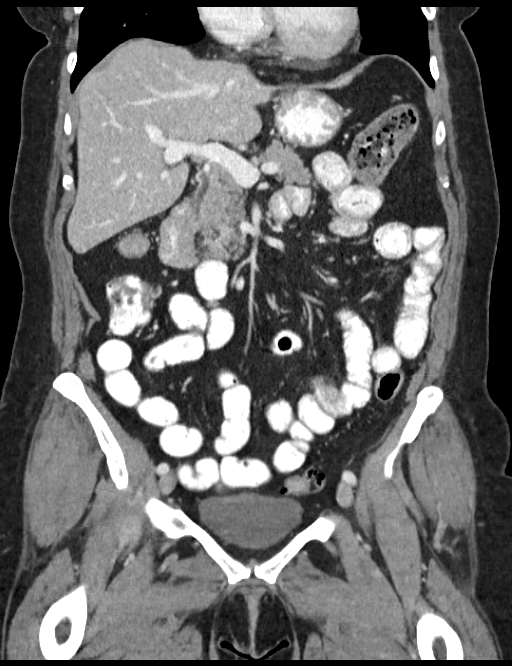
[im 81/146  soft-tissue]
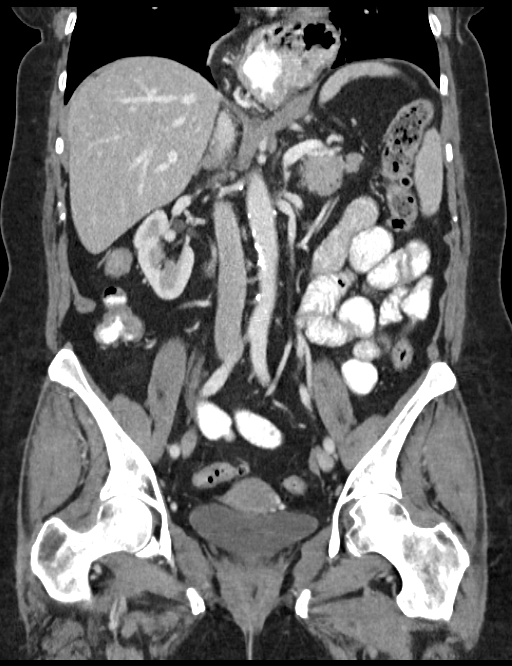

[15 of 46 positions shown; findings below may reference images not displayed]

FINDINGS: Unfortunately at the level where question of a polypoid lesion was
noted on lateral chest x-ray examination, there is under distension
a majority of the transverse colon as well as the hepatic flexure
and distal ascending colon. These portions of colon are therefore
not adequately assessed. The under distension may be related to
fortuitous under distension although result of prior
inflammation/ischemic cannot be excluded. There is however, no
evidence of extra luminal bowel inflammatory process, free fluid or
free air.

Prominent diverticula throughout the colon.

No inflammation surrounds the appendix or terminal ileum.

Large hiatal hernia.

Heart size within normal limits. Coronary artery calcifications.
Lung bases clear.

Liver lesions largest left lobe measuring up to 2.1 cm consistent
with a cyst. Some of the tiny liver lesions are too small to
adequately characterize.

No calcified gallstone. Phrygian cap incidentally noted. No common
bile duct dilation.

No worrisome hepatic splenic, pancreatic, renal or adrenal lesion.
Parapelvic cysts incidentally noted.

Atherosclerotic type changes of the abdominal aorta without
aneurysmal dilation. Atherosclerotic type changes branch vessels
without high-grade stenosis.

No adenopathy.

Degenerative changes lower lumbar spine most notable involving right
L4-5 facet joint and L5-S1 disc space. No osseous destructive
lesion.

Noncontrast filled imaging of the urinary bladder unremarkable. No
uterine or ovarian abnormality detected by CT.
IMPRESSION: Unfortunately at the level where question of a polypoid lesion was
noted on lateral chest x-ray examination, there is under distension
a majority of the transverse colon as well as the hepatic flexure
and distal ascending colon. These portions of colon are therefore
not adequately assessed. The under distension may be related to
fortuitous under distension although result of prior
inflammation/ischemic cannot be excluded. There is however, no
evidence of extra luminal bowel inflammatory process, free fluid or
free air. If further delineation were clinically desired,
colonoscopy (standard colonoscopy or virtual colonoscopy with gas
insufflation may be considered).

Prominent diverticula throughout the colon.

Large hiatal hernia.

Please see above.

## 2015-12-29 ENCOUNTER — Telehealth: Payer: Self-pay | Admitting: Family Medicine

## 2015-12-29 DIAGNOSIS — D508 Other iron deficiency anemias: Secondary | ICD-10-CM

## 2015-12-29 NOTE — Telephone Encounter (Signed)
Pt states she is having surgery 01/29/2016 for hernia repair.  Pt states states the doctor doing the surgery is concerned with pt iron.  Pt is asking if she can get a lab test without coming in to check her iron.  CB#[phone]/MW

## 2015-12-29 NOTE — Telephone Encounter (Signed)
Ok to order. Thanks.   

## 2015-12-30 ENCOUNTER — Telehealth: Payer: Self-pay

## 2015-12-30 DIAGNOSIS — D508 Other iron deficiency anemias: Secondary | ICD-10-CM

## 2015-12-30 NOTE — Telephone Encounter (Signed)
Lab sheet for Iron

## 2015-12-31 ENCOUNTER — Telehealth: Payer: Self-pay

## 2015-12-31 LAB — CBC WITH DIFFERENTIAL/PLATELET
BASOS ABS: 0 10*3/uL (ref 0.0–0.2)
Basos: 1 %
EOS (ABSOLUTE): 0.3 10*3/uL (ref 0.0–0.4)
Eos: 5 %
Hematocrit: 40.6 % (ref 34.0–46.6)
Hemoglobin: 13.5 g/dL (ref 11.1–15.9)
Immature Grans (Abs): 0 10*3/uL (ref 0.0–0.1)
Immature Granulocytes: 0 %
Lymphocytes Absolute: 1.1 10*3/uL (ref 0.7–3.1)
Lymphs: 20 %
MCH: 30.6 pg (ref 26.6–33.0)
MCHC: 33.3 g/dL (ref 31.5–35.7)
MCV: 92 fL (ref 79–97)
MONOS ABS: 0.6 10*3/uL (ref 0.1–0.9)
Monocytes: 12 %
NEUTROS ABS: 3.3 10*3/uL (ref 1.4–7.0)
NEUTROS PCT: 62 %
PLATELETS: 216 10*3/uL (ref 150–379)
RBC: 4.41 x10E6/uL (ref 3.77–5.28)
RDW: 14.4 % (ref 12.3–15.4)
WBC: 5.3 10*3/uL (ref 3.4–10.8)

## 2015-12-31 LAB — IRON AND TIBC
IRON SATURATION: 25 % (ref 15–55)
Iron: 96 ug/dL (ref 27–139)
TIBC: 383 ug/dL (ref 250–450)
UIBC: 287 ug/dL (ref 118–369)

## 2015-12-31 NOTE — Telephone Encounter (Signed)
Patient advised as directed below. 

## 2015-12-31 NOTE — Telephone Encounter (Signed)
-----   Message from Lorie PhenixNancy Maloney, MD sent at 12/31/2015 11:03 AM EDT ----- Not anemic.  Hgb 13.5  Thanks.

## 2016-01-14 ENCOUNTER — Telehealth: Payer: Self-pay

## 2016-01-14 DIAGNOSIS — F419 Anxiety disorder, unspecified: Secondary | ICD-10-CM

## 2016-01-14 MED ORDER — ESCITALOPRAM OXALATE 10 MG PO TABS: 10.0000 mg | ORAL_TABLET | Freq: Every day | ORAL | Status: DC

## 2016-01-14 NOTE — Telephone Encounter (Signed)
Patient states she is taking Lexapro 10 mg 1/2 tablet daily. Patient is requestuing to increase to 1 tablet daily. If that is okay patient needs a refill be sent to [facility] pharmacy. CB#[phone]

## 2016-01-21 ENCOUNTER — Other Ambulatory Visit: Payer: Self-pay | Admitting: Family Medicine

## 2016-01-21 DIAGNOSIS — E7800 Pure hypercholesterolemia, unspecified: Secondary | ICD-10-CM

## 2016-03-16 ENCOUNTER — Encounter: Payer: Self-pay | Admitting: Family Medicine

## 2016-03-16 ENCOUNTER — Ambulatory Visit (INDEPENDENT_AMBULATORY_CARE_PROVIDER_SITE_OTHER): Payer: Medicare Other | Admitting: Family Medicine

## 2016-03-16 VITALS — BP 122/82 | HR 76 | Temp 97.8°F | Resp 16 | Wt 152.0 lb

## 2016-03-16 DIAGNOSIS — Z8679 Personal history of other diseases of the circulatory system: Secondary | ICD-10-CM | POA: Diagnosis not present

## 2016-03-16 DIAGNOSIS — D508 Other iron deficiency anemias: Secondary | ICD-10-CM

## 2016-03-16 DIAGNOSIS — E7800 Pure hypercholesterolemia, unspecified: Secondary | ICD-10-CM | POA: Diagnosis not present

## 2016-03-16 NOTE — Progress Notes (Signed)
Patient: Kerry Stone Female    DOB: 1946/06/29   71 y.o.   MRN: [id number] Visit Date: 03/16/2016  Today's Provider: Lorie Phenix, MD   Chief Complaint  Patient presents with  . Hypertension  . Anxiety  . Hyperlipidemia   Subjective:    HPI      Hypertension, follow-up:  BP Readings from Last 3 Encounters:  03/16/16 122/82  08/25/15 124/70  07/30/15 163/93    She was last seen for hypertension 7 months ago.  BP at that visit was 127/70. Management since that visit includes no changes. Pt no longer taking medications for BP because she has lost about 18 pounds since having hiatal hernia surgery. She is exercising. Pt walks 2 miles daily. She is adherent to low salt diet.   Outside blood pressures are 110's/70's. She is experiencing none.  Patient denies chest pain, dyspnea, exertional chest pressure/discomfort, fatigue, irregular heart beat, lower extremity edema, orthopnea, palpitations and syncope.   Cardiovascular risk factors include advanced age (older than 22 for men, 72 for women) and dyslipidemia.     Weight trend: decreasing rapidly Wt Readings from Last 3 Encounters:  03/16/16 152 lb (68.947 kg)  08/25/15 170 lb (77.111 kg)  07/30/15 165 lb (74.844 kg)    Current diet: in general, a "healthy" diet    ------------------------------------------------------------------------    Follow up for Anxiety  The patient was last seen for this 7 months ago. Changes made at last visit include decreasing Lexapro to 10 mg and Xanax to 0.5 mg at bedtime.  She reports excellent compliance with treatment. She feels that condition is Improved. She is not having side effects.  ------------------------------------------------------------------------------------  Hyperlipidemia Pt would like to have lipid panel rechecked after losing weight.   No Known Allergies No outpatient prescriptions have been marked as taking for the 03/16/16 encounter (Office  Visit) with Lorie Phenix, MD.   Patient Active Problem List   Diagnosis Date Noted  . Biliary dyskinesia 07/30/2015  . Swallowing difficulty   . Stricture and stenosis of esophagus   . Anemia 05/26/2015  . GERD (gastroesophageal reflux disease) 04/02/2015  . Anxiety 03/18/2015  . Arthritis 03/18/2015  . Colon polyp 03/18/2015  . Overweight 03/18/2015  . RAD (reactive airway disease) 03/18/2015  . 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 Breast lump 01/09/2015  . Lump on finger 07/28/2013  . Adjustment reaction of adult life 11/18/2008  . Psoriasis 08/05/2008  . Diverticulitis of colon 11/09/2006  . Hypercholesteremia 07/11/2002  . History of hypertension 07/11/2002   Review of Systems  Constitutional: Negative for fever, chills, diaphoresis, activity change, appetite change, fatigue and unexpected weight change.  Respiratory: Negative for cough, shortness of breath and wheezing.   Cardiovascular: Negative for chest pain, palpitations and leg swelling.  Psychiatric/Behavioral: The patient is not nervous/anxious.     Social History  Substance Use Topics  . Smoking status: Former Smoker    Quit date: 02/25/1982  . Smokeless tobacco: Never Used     Comment: quit 30+ yrs ago  . Alcohol Use: 0.6 oz/week    1 Cans of beer per week     Comment: occasional   Objective:   BP 122/82 mmHg  Pulse 76  Temp(Src) 97.8 F (36.6 C) (Oral)  Resp 16  Wt 152 lb (68.947 kg)  Physical Exam  Constitutional: She is oriented to person, place, and time. She appears well-developed and well-nourished.  Neurological: She is alert and oriented to person, place, and time.  Psychiatric: She has a normal mood and affect. Her behavior is normal.  Vitals reviewed.     Assessment & Plan:     1. Hypercholesteremia Condition is stable. Please continue current medication and  plan of care as noted.  Will check labs.   - Comprehensive metabolic panel - Lipid panel  2. Other iron deficiency  anemias Feels so much better of surgery for hiatal hernia. Will recheck labs.   - CBC with Differential/Platelet - Iron and TIBC - Ferritin  3. History of hypertension Stable off medication.      Patient seen and examined by Leo GrosserNancy J. Tamika Nou, MD, and note scribed by Allene DillonEmily Drozdowski, CMA.  I have reviewed the document for accuracy and completeness and I agree with above. - Leo GrosserNancy J. Veneda Kirksey, MD   Lorie PhenixNancy Raynetta Osterloh, MD  [facility]

## 2016-03-17 ENCOUNTER — Other Ambulatory Visit: Payer: Self-pay | Admitting: Family Medicine

## 2016-03-18 LAB — IRON AND TIBC
IRON: 63 ug/dL (ref 27–139)
Iron Saturation: 19 % (ref 15–55)
Total Iron Binding Capacity: 325 ug/dL (ref 250–450)
UIBC: 262 ug/dL (ref 118–369)

## 2016-03-18 LAB — LIPID PANEL
CHOLESTEROL TOTAL: 144 mg/dL (ref 100–199)
Chol/HDL Ratio: 2.7 ratio units (ref 0.0–4.4)
HDL: 54 mg/dL (ref >39)
LDL Calculated: 72 mg/dL (ref 0–99)
TRIGLYCERIDES: 88 mg/dL (ref 0–149)
VLDL CHOLESTEROL CAL: 18 mg/dL (ref 5–40)

## 2016-03-18 LAB — CBC WITH DIFFERENTIAL/PLATELET
BASOS ABS: 0 10*3/uL (ref 0.0–0.2)
BASOS: 1 %
EOS (ABSOLUTE): 0.3 10*3/uL (ref 0.0–0.4)
EOS: 7 %
Hematocrit: 39.1 % (ref 34.0–46.6)
Hemoglobin: 13.3 g/dL (ref 11.1–15.9)
IMMATURE GRANS (ABS): 0 10*3/uL (ref 0.0–0.1)
IMMATURE GRANULOCYTES: 0 %
LYMPHS ABS: 1 10*3/uL (ref 0.7–3.1)
LYMPHS: 25 %
MCH: 30 pg (ref 26.6–33.0)
MCHC: 34 g/dL (ref 31.5–35.7)
MCV: 88 fL (ref 79–97)
MONOCYTES: 14 %
Monocytes Absolute: 0.6 10*3/uL (ref 0.1–0.9)
NEUTROS ABS: 2.2 10*3/uL (ref 1.4–7.0)
Neutrophils: 53 %
PLATELETS: 182 10*3/uL (ref 150–379)
RBC: 4.44 x10E6/uL (ref 3.77–5.28)
RDW: 13.9 % (ref 12.3–15.4)
WBC: 4 10*3/uL (ref 3.4–10.8)

## 2016-03-18 LAB — COMPREHENSIVE METABOLIC PANEL
ALK PHOS: 62 IU/L (ref 39–117)
ALT: 14 IU/L (ref 0–32)
AST: 24 IU/L (ref 0–40)
Albumin/Globulin Ratio: 1.9 (ref 1.2–2.2)
Albumin: 4.4 g/dL (ref 3.6–4.8)
BILIRUBIN TOTAL: 0.6 mg/dL (ref 0.0–1.2)
BUN/Creatinine Ratio: 15 (ref 12–28)
BUN: 14 mg/dL (ref 8–27)
CHLORIDE: 103 mmol/L (ref 96–106)
CO2: 22 mmol/L (ref 18–29)
Calcium: 10.1 mg/dL (ref 8.7–10.3)
Creatinine, Ser: 0.96 mg/dL (ref 0.57–1.00)
GFR calc Af Amer: 71 mL/min/{1.73_m2} (ref >59)
GFR calc non Af Amer: 61 mL/min/{1.73_m2} (ref >59)
Globulin, Total: 2.3 g/dL (ref 1.5–4.5)
Glucose: 75 mg/dL (ref 65–99)
Potassium: 3.8 mmol/L (ref 3.5–5.2)
Sodium: 146 mmol/L — ABNORMAL HIGH (ref 134–144)
TOTAL PROTEIN: 6.7 g/dL (ref 6.0–8.5)

## 2016-03-18 LAB — SPECIMEN STATUS REPORT

## 2016-03-18 LAB — FERRITIN: FERRITIN: 37 ng/mL (ref 15–150)

## 2016-03-31 ENCOUNTER — Other Ambulatory Visit: Payer: Self-pay | Admitting: Family Medicine

## 2016-03-31 DIAGNOSIS — K219 Gastro-esophageal reflux disease without esophagitis: Secondary | ICD-10-CM

## 2016-04-01 NOTE — Telephone Encounter (Signed)
LOV 03/16/2016. Allene DillonEmily Drozdowski, CMA

## 2016-06-18 IMAGING — NM NM HEPATO W/GB/PHARM/[PERSON_NAME]
2 series · 12 of 12 positions shown · non-contrast
Comparison: None.

CLINICAL DATA: Chronic abdominal pain, primarily post-prandial

EXAM:
NUCLEAR MEDICINE HEPATOBILIARY IMAGING WITH GALLBLADDER EF
Views: Anterior right upper quadrant
Radionuclide: Technetium 99m Choletec
Dose:  5.35 mCi
Route of administration: Intravenous

[Series 1000: hepatobiliary scan · 9.59mm/px · 6 of 60 frames shown]
[frame 6/60]
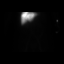
[frame 16/60]
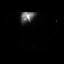
[frame 26/60]
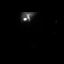
[frame 36/60]
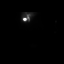
[frame 46/60]
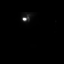
[frame 56/60]
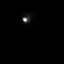

[Series 1000: gallbladder ef · 4.80mm/px · 6 of 120 frames shown]
[frame 11/120]
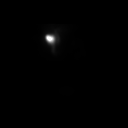
[frame 31/120]
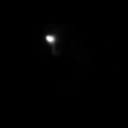
[frame 51/120]
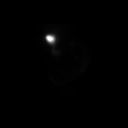
[frame 71/120]
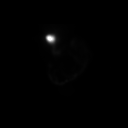
[frame 91/120]
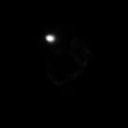
[frame 111/120]
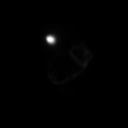

[12 of 12 positions shown; findings below may reference images not displayed]

FINDINGS: Liver uptake of radiotracer is normal. There is prompt visualization
of gallbladder and small bowel, indicating patency of the cystic and
common bile ducts. A weight based dose, 1.5 mcg, of CCK was
administered intravenously with calculation of the computer
generated ejection fraction of radiotracer from the gallbladder. The
patient did not report clinical symptoms with the CCK
administration. The computer generated ejection fraction of
radiotracer from the gallbladder is diminished at 18%, normal
greater than 38%.
IMPRESSION: Abnormally low ejection fraction of radiotracer from the
gallbladder, a finding concerning for biliary dyskinesia. Cystic and
common bile ducts are patent as is evidenced by gallbladder and
small bowel visualization.

## 2016-06-25 ENCOUNTER — Encounter: Payer: Self-pay | Admitting: Physician Assistant

## 2016-06-25 ENCOUNTER — Ambulatory Visit (INDEPENDENT_AMBULATORY_CARE_PROVIDER_SITE_OTHER): Payer: Medicare Other | Admitting: Physician Assistant

## 2016-06-25 VITALS — BP 126/70 | HR 74 | Temp 97.3°F | Resp 16 | Wt 139.0 lb

## 2016-06-25 DIAGNOSIS — Z23 Encounter for immunization: Secondary | ICD-10-CM

## 2016-06-25 DIAGNOSIS — E7800 Pure hypercholesterolemia, unspecified: Secondary | ICD-10-CM

## 2016-06-25 DIAGNOSIS — Z862 Personal history of diseases of the blood and blood-forming organs and certain disorders involving the immune mechanism: Secondary | ICD-10-CM | POA: Diagnosis not present

## 2016-06-25 NOTE — Progress Notes (Signed)
Patient: Kerry Stone Female    DOB: 10-28-1945   70 y.o.   MRN: [id number] Visit Date: 06/25/2016  Today's Provider: Margaretann Loveless, PA-C   Chief Complaint  Patient presents with  . Hyperlipidemia   Subjective:    HPI  Lipid/Cholesterol, Follow-up:   Last seen for this 3 months ago.  Management changes since that visit include check labs and pt was advised to cut simvastatin in half. . Last Lipid Panel:    Component Value Date/Time   CHOL 144 03/17/2016 0835   TRIG 88 03/17/2016 0835   HDL 54 03/17/2016 0835   CHOLHDL 2.7 03/17/2016 0835   LDLCALC 72 03/17/2016 0835    Risk factors for vascular disease include hypercholesterolemia and hypertension  She reports excellent compliance with treatment. She is not having side effects.  Current symptoms include none and have been stable. Weight trend: decreasing steadily Prior visit with dietician: no Current diet: in general, a "healthy" diet   Current exercise: walking  Wt Readings from Last 3 Encounters:  06/25/16 139 lb (63 kg)  03/16/16 152 lb (68.9 kg)  08/25/15 170 lb (77.1 kg)   -------------------------------------------------------------------     No Known Allergies   Current Outpatient Prescriptions:  .  ALPRAZolam (XANAX) 0.5 MG tablet, TAKE ONE TABLET BY MOUTH AT BEDTIME, Disp: 30 tablet, Rfl: 5 .  desonide (DESOWEN) 0.05 % ointment, , Disp: , Rfl:  .  escitalopram (LEXAPRO) 10 MG tablet, Take 1 tablet (10 mg total) by mouth daily., Disp: 90 tablet, Rfl: 3 .  estradiol (ESTRACE) 0.1 MG/GM vaginal cream, Place vaginally. , Disp: , Rfl:  .  fluticasone (CUTIVATE) 0.005 % ointment, continuously as needed., Disp: , Rfl:  .  ibuprofen (ADVIL,MOTRIN) 200 MG tablet, as needed., Disp: , Rfl:  .  Loratadine 10 MG CAPS, Take by mouth as needed., Disp: , Rfl:  .  omeprazole (PRILOSEC) 20 MG capsule, TAKE 1 CAPSULE BY MOUTH DAILY, Disp: 90 capsule, Rfl: 3 .  simvastatin (ZOCOR) 20 MG tablet, TAKE  1 TABLET BY MOUTH EVERY DAY, Disp: 90 tablet, Rfl: 1  Review of Systems  Constitutional: Negative.   HENT: Negative.   Respiratory: Negative.   Cardiovascular: Negative.   Endocrine: Negative.   Psychiatric/Behavioral: Negative.     Social History  Substance Use Topics  . Smoking status: Former Smoker    Quit date: 02/25/1982  . Smokeless tobacco: Never Used     Comment: quit 30+ yrs ago  . Alcohol use 0.6 oz/week    1 Cans of beer per week     Comment: occasional   Objective:   BP 126/70 (BP Location: Left Arm, Patient Position: Sitting, Cuff Size: Large)   Pulse 74   Temp 97.3 F (36.3 C) (Oral)   Resp 16   Wt 139 lb (63 kg)   SpO2 98%   BMI 24.62 kg/m   Physical Exam  Constitutional: She appears well-developed and well-nourished. No distress.  Neck: Normal range of motion. Neck supple. No tracheal deviation present. No thyromegaly present.  Cardiovascular: Normal rate, regular rhythm and normal heart sounds.  Exam reveals no gallop and no friction rub.   No murmur heard. Pulmonary/Chest: Effort normal and breath sounds normal. No respiratory distress. She has no wheezes. She has no rales.  Musculoskeletal: She exhibits no edema.  Lymphadenopathy:    She has no cervical adenopathy.  Skin: She is not diaphoretic.  Vitals reviewed.     Assessment & Plan:  1. Hypercholesteremia Stable.Continue simvastatin 10mg  (1/2 20 mg) daily. Will check labs as below and f/u pending results. - Lipid panel  2. Need for influenza vaccination Flu vaccine given today without complication. Patient sat upright for 15 minutes to check for adverse reaction before being released. - Flu vaccine HIGH DOSE PF  3. H/O iron deficiency anemia Previous labs have been stable. Will recheck labs and make sure no changes. Will f/u pending results. - Iron Binding Cap (TIBC) - Ferritin - CBC with Differential       Margaretann LovelessJennifer M Burnette, PA-C  [facility]

## 2016-06-25 NOTE — Patient Instructions (Signed)

## 2016-06-29 LAB — CBC WITH DIFFERENTIAL/PLATELET
Basophils Absolute: 0 10*3/uL (ref 0.0–0.2)
Basos: 1 %
EOS (ABSOLUTE): 0.2 10*3/uL (ref 0.0–0.4)
Eos: 4 %
HEMOGLOBIN: 13 g/dL (ref 11.1–15.9)
Hematocrit: 38.8 % (ref 34.0–46.6)
Immature Grans (Abs): 0 10*3/uL (ref 0.0–0.1)
Immature Granulocytes: 0 %
LYMPHS ABS: 1.2 10*3/uL (ref 0.7–3.1)
Lymphs: 28 %
MCH: 29.7 pg (ref 26.6–33.0)
MCHC: 33.5 g/dL (ref 31.5–35.7)
MCV: 89 fL (ref 79–97)
MONOCYTES: 11 %
Monocytes Absolute: 0.5 10*3/uL (ref 0.1–0.9)
NEUTROS ABS: 2.5 10*3/uL (ref 1.4–7.0)
Neutrophils: 56 %
Platelets: 180 10*3/uL (ref 150–379)
RBC: 4.37 x10E6/uL (ref 3.77–5.28)
RDW: 14.8 % (ref 12.3–15.4)
WBC: 4.5 10*3/uL (ref 3.4–10.8)

## 2016-06-29 LAB — IRON AND TIBC
IRON SATURATION: 26 % (ref 15–55)
Iron: 82 ug/dL (ref 27–139)
TIBC: 310 ug/dL (ref 250–450)
UIBC: 228 ug/dL (ref 118–369)

## 2016-06-29 LAB — FERRITIN: FERRITIN: 42 ng/mL (ref 15–150)

## 2016-06-29 LAB — LIPID PANEL
Chol/HDL Ratio: 2.7 ratio units (ref 0.0–4.4)
Cholesterol, Total: 169 mg/dL (ref 100–199)
HDL: 62 mg/dL (ref >39)
LDL Calculated: 88 mg/dL (ref 0–99)
Triglycerides: 93 mg/dL (ref 0–149)
VLDL Cholesterol Cal: 19 mg/dL (ref 5–40)

## 2016-07-02 ENCOUNTER — Other Ambulatory Visit: Payer: Self-pay | Admitting: Family Medicine

## 2016-07-02 DIAGNOSIS — F419 Anxiety disorder, unspecified: Secondary | ICD-10-CM

## 2016-07-02 NOTE — Telephone Encounter (Signed)
Please review-aa 

## 2016-07-05 ENCOUNTER — Telehealth: Payer: Self-pay | Admitting: Physician Assistant

## 2016-07-05 NOTE — Telephone Encounter (Signed)
Yes ok to schedule.

## 2016-07-05 NOTE — Telephone Encounter (Signed)
Pt. To be scheduled for CPE and AWV with Nurse Health Advisor. frmr pt of Dr. Elease HashimotoMaloney, need approval to schedule the CPE with J. Burnette. thnx -knb

## 2016-07-12 ENCOUNTER — Telehealth: Payer: Self-pay | Admitting: Physician Assistant

## 2016-07-12 NOTE — Telephone Encounter (Signed)
Called Pt to schedule CPE with PCP for 11/17 3pm Medicare pt needs AWV scheduled on separate day. Thnx, KNB

## 2016-08-02 ENCOUNTER — Other Ambulatory Visit: Payer: Self-pay | Admitting: Family Medicine

## 2016-08-02 DIAGNOSIS — E7800 Pure hypercholesterolemia, unspecified: Secondary | ICD-10-CM

## 2016-10-04 ENCOUNTER — Telehealth: Payer: Self-pay | Admitting: Physician Assistant

## 2016-10-04 NOTE — Telephone Encounter (Signed)
Pt is scheduled for medication F/U on 11/09/16 but had a question about her BP medication. Pt stated that after she had surgery and had lost weight her BP had improved and she had stopped taking lisinopril (PRINIVIL,ZESTRIL) 2.5 MG tablet. Pt stated that just before the holidays her BP was 130/78 and on New Year's Eve her BP went to 178/112. Pt stated that she had some left over lisinopril and she started taking it again. Pt stated her BP is down to 106/68 and she continues to take the lisinopril. Pt is asking if she should continue taking the medication until her F/U visit or if she should stop taking the medication. Please advise. Thanks TNP

## 2016-10-04 NOTE — Telephone Encounter (Signed)
Continue lisinopril for now and we will re-evaluate at f/u.

## 2016-10-04 NOTE — Telephone Encounter (Signed)
Please review. Mckynzi Cammon Drozdowski, CMA  

## 2016-10-04 NOTE — Telephone Encounter (Signed)
Pt advised. Edmund Rick Drozdowski, CMA  

## 2016-10-06 ENCOUNTER — Ambulatory Visit (INDEPENDENT_AMBULATORY_CARE_PROVIDER_SITE_OTHER): Payer: Medicare Other | Admitting: Physician Assistant

## 2016-10-06 ENCOUNTER — Encounter: Payer: Self-pay | Admitting: Physician Assistant

## 2016-10-06 VITALS — BP 112/68 | HR 75 | Temp 97.8°F | Resp 16 | Wt 135.0 lb

## 2016-10-06 DIAGNOSIS — E7800 Pure hypercholesterolemia, unspecified: Secondary | ICD-10-CM

## 2016-10-06 DIAGNOSIS — I10 Essential (primary) hypertension: Secondary | ICD-10-CM

## 2016-10-06 NOTE — Progress Notes (Signed)
Patient: Kerry Stone Female    DOB: 05/24/1946   71 y.o.   MRN: [id number] Visit Date: 10/06/2016  Today's Provider: Margaretann LovelessJennifer M Burnette, PA-C   Chief Complaint  Patient presents with  . Concern about BP reading   Subjective:    HPI Patient is here today with c'o of blood pressure readings. She reports that before the holidays her blood pressure was 130/78 and had improved after the surgery and losing weight. She had stopped the Lisinopril but on New Year's eve BP was 178/112 and she re-started the Lisinopril. She reports her blood Pressure went down to 106/68 yesterday . BP today in office is 112/68. She reports that she didn't take the Lisinopril this morning.She is Exercising, walks 2-3 miles 4-5 times a week. She is Adherent to low salt diet. Cardiac symptoms dizziness. Patient denies chest pain, claudication, exertional chest pressure/discomfort, irregular heart beat, lower extremity edema and palpitations.  Cardiovascular risk factors: advanced age (older than 155 for men, 7465 for women) and dyslipidemia.   Patient reports that over the holidays there was a lot of family stressors and she also did not eat as healthy as she normally does and she was not exercising either. She does have a cuff at home and does check her BP regularly.    Lipid/Cholesterol, Follow-up:  Patient is asking to get her Cholesterol check, to see if is necessary for her to continue the Simvastatin.  Last Lipid Panel:    Component Value Date/Time   CHOL 169 06/28/2016 0809   TRIG 93 06/28/2016 0809   HDL 62 06/28/2016 0809   CHOLHDL 2.7 06/28/2016 0809   LDLCALC 88 06/28/2016 0809    Risk factors for vascular disease include hypertension  She reports excellent compliance with treatment. She is not having side effects.  Current symptoms include none and have been stable. Weight trend: stable Current exercise: walking  Wt Readings from Last 3 Encounters:  10/06/16 135 lb (61.2 kg)    06/25/16 139 lb (63 kg)  03/16/16 152 lb (68.9 kg)    -------------------------------------------------------------------     No Known Allergies   Current Outpatient Prescriptions:  .  ALPRAZolam (XANAX) 0.5 MG tablet, TAKE 1 TABLET AT BEDTIME, Disp: 30 tablet, Rfl: 5 .  desonide (DESOWEN) 0.05 % ointment, , Disp: , Rfl:  .  escitalopram (LEXAPRO) 10 MG tablet, Take 1 tablet (10 mg total) by mouth daily., Disp: 90 tablet, Rfl: 3 .  estradiol (ESTRACE) 0.1 MG/GM vaginal cream, Place vaginally. , Disp: , Rfl:  .  ibuprofen (ADVIL,MOTRIN) 200 MG tablet, as needed., Disp: , Rfl:  .  simvastatin (ZOCOR) 20 MG tablet, TAKE 1 TABLET BY MOUTH EVERY DAY (Patient taking differently: She is taking 1/2 the pill), Disp: 90 tablet, Rfl: 1 .  fluticasone (CUTIVATE) 0.005 % ointment, continuously as needed., Disp: , Rfl:  .  Loratadine 10 MG CAPS, Take by mouth as needed., Disp: , Rfl:  .  omeprazole (PRILOSEC) 20 MG capsule, TAKE 1 CAPSULE BY MOUTH DAILY (Patient not taking: Reported on 10/06/2016), Disp: 90 capsule, Rfl: 3  Review of Systems  Constitutional: Negative.   Respiratory: Negative.   Cardiovascular: Negative.   Gastrointestinal: Negative.   Neurological: Negative.   Psychiatric/Behavioral: Positive for agitation (over the holidays). The patient is nervous/anxious (over the holidays).     Social History  Substance Use Topics  . Smoking status: Former Smoker    Quit date: 02/25/1982  . Smokeless tobacco: Never Used  Comment: quit 30+ yrs ago  . Alcohol use 0.6 oz/week    1 Cans of beer per week     Comment: occasional   Objective:   BP 112/68 (BP Location: Right Arm, Patient Position: Sitting, Cuff Size: Normal)   Pulse 75   Temp 97.8 F (36.6 C) (Oral)   Resp 16   Wt 135 lb (61.2 kg)   BMI 23.91 kg/m   Physical Exam  Constitutional: She appears well-developed and well-nourished. No distress.  Neck: Normal range of motion. Neck supple. No tracheal deviation present.  No thyromegaly present.  Cardiovascular: Normal rate, regular rhythm and normal heart sounds.  Exam reveals no gallop and no friction rub.   No murmur heard. Pulmonary/Chest: Effort normal and breath sounds normal. No respiratory distress. She has no wheezes. She has no rales.  Musculoskeletal: She exhibits no edema.  Lymphadenopathy:    She has no cervical adenopathy.  Skin: She is not diaphoretic.  Psychiatric: She has a normal mood and affect. Her behavior is normal. Judgment and thought content normal.  Vitals reviewed.     Assessment & Plan:     1. Essential hypertension Stable today. Will stop lisinopril again since BP is back to normal and she is back on healthy lifestyle. She is only taking lisinopril 2.5mg . She is to keep the lisinopril on hand in case her at home readings start increasing again and call the office.  - CBC w/Diff/Platelet - Comprehensive Metabolic Panel (CMET)  2. Hypercholesterolemia Previosuly stable. Had started taking half a tab of simvastatin 20mg . Will recheck labs as below and f/u pending results. If labs stable will continue simvastatin 10mg , if elevated from previous will increase back to full tab at simvastatin 20mg . - Lipid Profile       Margaretann Loveless, PA-C  [facility]

## 2016-10-07 LAB — COMPREHENSIVE METABOLIC PANEL
A/G RATIO: 2.3 — AB (ref 1.2–2.2)
ALT: 23 IU/L (ref 0–32)
AST: 27 IU/L (ref 0–40)
Albumin: 4.5 g/dL (ref 3.6–4.8)
Alkaline Phosphatase: 69 IU/L (ref 39–117)
BILIRUBIN TOTAL: 0.7 mg/dL (ref 0.0–1.2)
BUN/Creatinine Ratio: 17 (ref 12–28)
BUN: 16 mg/dL (ref 8–27)
CHLORIDE: 102 mmol/L (ref 96–106)
CO2: 24 mmol/L (ref 18–29)
Calcium: 9.8 mg/dL (ref 8.7–10.3)
Creatinine, Ser: 0.93 mg/dL (ref 0.57–1.00)
GFR calc Af Amer: 73 mL/min/{1.73_m2} (ref >59)
GFR calc non Af Amer: 63 mL/min/{1.73_m2} (ref >59)
GLUCOSE: 77 mg/dL (ref 65–99)
Globulin, Total: 2 g/dL (ref 1.5–4.5)
POTASSIUM: 4.5 mmol/L (ref 3.5–5.2)
Sodium: 142 mmol/L (ref 134–144)
TOTAL PROTEIN: 6.5 g/dL (ref 6.0–8.5)

## 2016-10-07 LAB — LIPID PANEL
CHOLESTEROL TOTAL: 191 mg/dL (ref 100–199)
Chol/HDL Ratio: 3 ratio units (ref 0.0–4.4)
HDL: 63 mg/dL (ref >39)
LDL Calculated: 112 mg/dL — ABNORMAL HIGH (ref 0–99)
TRIGLYCERIDES: 81 mg/dL (ref 0–149)
VLDL CHOLESTEROL CAL: 16 mg/dL (ref 5–40)

## 2016-10-07 LAB — CBC WITH DIFFERENTIAL/PLATELET
BASOS ABS: 0 10*3/uL (ref 0.0–0.2)
BASOS: 1 %
EOS (ABSOLUTE): 0.1 10*3/uL (ref 0.0–0.4)
Eos: 3 %
Hematocrit: 41.5 % (ref 34.0–46.6)
Hemoglobin: 13.6 g/dL (ref 11.1–15.9)
IMMATURE GRANS (ABS): 0 10*3/uL (ref 0.0–0.1)
IMMATURE GRANULOCYTES: 0 %
LYMPHS: 26 %
Lymphocytes Absolute: 1.2 10*3/uL (ref 0.7–3.1)
MCH: 29.9 pg (ref 26.6–33.0)
MCHC: 32.8 g/dL (ref 31.5–35.7)
MCV: 91 fL (ref 79–97)
MONOS ABS: 0.5 10*3/uL (ref 0.1–0.9)
Monocytes: 11 %
NEUTROS ABS: 2.7 10*3/uL (ref 1.4–7.0)
NEUTROS PCT: 59 %
PLATELETS: 193 10*3/uL (ref 150–379)
RBC: 4.55 x10E6/uL (ref 3.77–5.28)
RDW: 14.5 % (ref 12.3–15.4)
WBC: 4.5 10*3/uL (ref 3.4–10.8)

## 2016-10-07 MED ORDER — SIMVASTATIN 20 MG PO TABS: 20.0000 mg | ORAL_TABLET | Freq: Every day | ORAL | 1 refills | Status: DC

## 2016-11-09 ENCOUNTER — Ambulatory Visit: Payer: Medicare Other | Admitting: Physician Assistant

## 2016-12-03 LAB — HM MAMMOGRAPHY

## 2016-12-21 ENCOUNTER — Telehealth: Payer: Self-pay | Admitting: Physician Assistant

## 2016-12-21 NOTE — Telephone Encounter (Signed)
Called Pt to schedule AWV with [facility] - knb °

## 2017-01-17 ENCOUNTER — Telehealth: Payer: Self-pay | Admitting: Physician Assistant

## 2017-01-17 NOTE — Telephone Encounter (Signed)
Called Pt to schedule AWV with [facility] - knb °

## 2017-01-25 ENCOUNTER — Other Ambulatory Visit: Payer: Self-pay | Admitting: Physician Assistant

## 2017-01-25 DIAGNOSIS — F419 Anxiety disorder, unspecified: Secondary | ICD-10-CM

## 2017-01-25 NOTE — Telephone Encounter (Signed)
[facility] ) faxed a request on the following medication.  Thanks CC  escitalopram (LEXAPRO) 10 MG tablet  Take 1 table by mouth once daily.

## 2017-01-26 MED ORDER — ESCITALOPRAM OXALATE 10 MG PO TABS: 10.0000 mg | ORAL_TABLET | Freq: Every day | ORAL | 3 refills | Status: AC

## 2017-04-25 ENCOUNTER — Other Ambulatory Visit: Payer: Self-pay | Admitting: Physician Assistant

## 2017-04-25 DIAGNOSIS — E7800 Pure hypercholesterolemia, unspecified: Secondary | ICD-10-CM

## 2017-04-25 DIAGNOSIS — F419 Anxiety disorder, unspecified: Secondary | ICD-10-CM

## 2017-04-25 MED ORDER — ALPRAZOLAM 0.5 MG PO TABS: 0.5000 mg | ORAL_TABLET | Freq: Every day | ORAL | 1 refills | Status: AC

## 2017-04-25 NOTE — Telephone Encounter (Signed)
RX called in-aa 

## 2017-04-25 NOTE — Telephone Encounter (Signed)
Patient wanted to add the generic xanax 0.5 mg  [facility] [address]  [facility] [phone]  pt'

## 2017-05-19 ENCOUNTER — Encounter: Payer: Self-pay | Admitting: Physician Assistant

## 2017-06-23 ENCOUNTER — Ambulatory Visit: Payer: Medicare Other

## 2017-06-29 ENCOUNTER — Telehealth: Payer: Self-pay

## 2017-06-29 NOTE — Telephone Encounter (Signed)
LM for pt to CB and scheule her AWV. Pt cancelled last AWV on 06/23/17. -MM

## 2020-03-26 ENCOUNTER — Telehealth: Payer: Self-pay

## 2020-03-26 NOTE — Telephone Encounter (Signed)
Copied from CRM [id number]. Topic: General - Other >> Mar 26, 2020  9:32 AM Dalphine Handing A wrote: Patient has an appointment today with her cardiologist at 2pm and needs to know and confirm the date of her last colonoscopy. Patient has moved to [facility] and needs the information or her appointment today. Please advise  Date of last Colonoscopy given.
